# Patient Record
Sex: Female | Born: 1950 | Race: White | Hispanic: No | Marital: Married | Smoking: Former smoker
Health system: Southern US, Community
[De-identification: ages and names within clinical notes are randomized; demographics above are authoritative.]

## PROBLEM LIST (undated history)

## (undated) DIAGNOSIS — E109 Type 1 diabetes mellitus without complications: Secondary | ICD-10-CM

## (undated) DIAGNOSIS — E785 Hyperlipidemia, unspecified: Secondary | ICD-10-CM

## (undated) DIAGNOSIS — Z780 Asymptomatic menopausal state: Secondary | ICD-10-CM

## (undated) HISTORY — DX: Type 1 diabetes mellitus without complications: E10.9

## (undated) HISTORY — PX: CERVICAL CONE BIOPSY: SUR198

## (undated) HISTORY — DX: Hyperlipidemia, unspecified: E78.5

## (undated) HISTORY — DX: Asymptomatic menopausal state: Z78.0

## (undated) HISTORY — PX: CATARACT EXTRACTION: SUR2

## (undated) HISTORY — PX: BLEPHAROPLASTY: SUR158

---

## 1999-04-19 ENCOUNTER — Encounter: Admission: RE | Admit: 1999-04-19 | Discharge: 1999-07-18 | Payer: Self-pay | Admitting: Endocrinology

## 1999-12-15 ENCOUNTER — Other Ambulatory Visit: Admission: RE | Admit: 1999-12-15 | Discharge: 1999-12-15 | Payer: Self-pay | Admitting: Family Medicine

## 2006-04-03 ENCOUNTER — Ambulatory Visit: Payer: Self-pay | Admitting: Family Medicine

## 2006-05-03 ENCOUNTER — Ambulatory Visit: Payer: Self-pay | Admitting: Family Medicine

## 2006-05-03 ENCOUNTER — Other Ambulatory Visit: Admission: RE | Admit: 2006-05-03 | Discharge: 2006-05-03 | Payer: Self-pay | Admitting: Family Medicine

## 2006-05-03 ENCOUNTER — Encounter (INDEPENDENT_AMBULATORY_CARE_PROVIDER_SITE_OTHER): Payer: Self-pay | Admitting: Family Medicine

## 2006-05-03 LAB — CONVERTED CEMR LAB
Basophils Relative: 0.7 % (ref 0.0–1.0)
Cholesterol: 198 mg/dL (ref 0–200)
Eosinophils Absolute: 0.3 10*3/uL (ref 0.0–0.6)
Eosinophils Relative: 6.1 % — ABNORMAL HIGH (ref 0.0–5.0)
HDL: 77.7 mg/dL (ref >39.0)
LDL Cholesterol: 112 mg/dL — ABNORMAL HIGH (ref 0–99)
MCV: 82.4 fL (ref 78.0–100.0)
Monocytes Absolute: 0.5 10*3/uL (ref 0.2–0.7)
Neutrophils Relative %: 61 % (ref 43.0–77.0)
Platelets: 329 10*3/uL (ref 150–400)
RBC: 4.56 M/uL (ref 3.87–5.11)
RDW: 13.1 % (ref 11.5–14.6)
Total CHOL/HDL Ratio: 2.5
Triglycerides: 44 mg/dL (ref 0–149)
VLDL: 9 mg/dL (ref 0–40)

## 2006-08-21 ENCOUNTER — Encounter: Admission: RE | Admit: 2006-08-21 | Discharge: 2006-08-21 | Payer: Self-pay | Admitting: Family Medicine

## 2006-09-01 ENCOUNTER — Encounter (INDEPENDENT_AMBULATORY_CARE_PROVIDER_SITE_OTHER): Payer: Self-pay | Admitting: Family Medicine

## 2006-09-05 ENCOUNTER — Telehealth (INDEPENDENT_AMBULATORY_CARE_PROVIDER_SITE_OTHER): Payer: Self-pay | Admitting: Family Medicine

## 2006-09-07 ENCOUNTER — Ambulatory Visit: Payer: Self-pay | Admitting: Family Medicine

## 2006-09-08 ENCOUNTER — Ambulatory Visit: Payer: Self-pay | Admitting: Family Medicine

## 2006-09-08 LAB — CONVERTED CEMR LAB
Basophils Relative: 0 % (ref 0–1)
HCT: 40 % (ref 36.0–46.0)
Hemoglobin: 12.8 g/dL (ref 12.0–15.0)
Lymphs Abs: 1.9 10*3/uL (ref 0.7–3.3)
MCHC: 32 g/dL (ref 30.0–36.0)
MCV: 84.7 fL (ref 78.0–100.0)
Monocytes Relative: 6 % (ref 3–11)
Neutro Abs: 10.6 10*3/uL — ABNORMAL HIGH (ref 1.7–7.7)
Platelets: 297 10*3/uL (ref 150–400)
RBC: 4.72 M/uL (ref 3.87–5.11)
RDW: 14.6 % — ABNORMAL HIGH (ref 11.5–14.0)

## 2006-09-15 ENCOUNTER — Ambulatory Visit: Payer: Self-pay | Admitting: Family Medicine

## 2006-09-15 ENCOUNTER — Telehealth (INDEPENDENT_AMBULATORY_CARE_PROVIDER_SITE_OTHER): Payer: Self-pay | Admitting: *Deleted

## 2006-09-15 LAB — CONVERTED CEMR LAB
Basophils Absolute: 0.1 10*3/uL (ref 0.0–0.1)
Eosinophils Absolute: 0.7 10*3/uL — ABNORMAL HIGH (ref 0.0–0.6)
HCT: 38.3 % (ref 36.0–46.0)
Hemoglobin: 12.8 g/dL (ref 12.0–15.0)
MCHC: 33.5 g/dL (ref 30.0–36.0)
MCV: 84.2 fL (ref 78.0–100.0)
Neutro Abs: 3 10*3/uL (ref 1.4–7.7)
RDW: 13.2 % (ref 11.5–14.6)
WBC: 6.4 10*3/uL (ref 4.5–10.5)

## 2006-09-18 ENCOUNTER — Encounter (INDEPENDENT_AMBULATORY_CARE_PROVIDER_SITE_OTHER): Payer: Self-pay | Admitting: *Deleted

## 2006-09-27 ENCOUNTER — Encounter (INDEPENDENT_AMBULATORY_CARE_PROVIDER_SITE_OTHER): Payer: Self-pay | Admitting: Family Medicine

## 2006-11-06 ENCOUNTER — Encounter (INDEPENDENT_AMBULATORY_CARE_PROVIDER_SITE_OTHER): Payer: Self-pay | Admitting: Family Medicine

## 2007-01-06 ENCOUNTER — Ambulatory Visit: Payer: Self-pay | Admitting: Internal Medicine

## 2007-01-06 DIAGNOSIS — E109 Type 1 diabetes mellitus without complications: Secondary | ICD-10-CM | POA: Insufficient documentation

## 2007-01-28 ENCOUNTER — Inpatient Hospital Stay (HOSPITAL_COMMUNITY): Admission: EM | Admit: 2007-01-28 | Discharge: 2007-01-31 | Payer: Self-pay | Admitting: Emergency Medicine

## 2007-01-30 ENCOUNTER — Encounter (INDEPENDENT_AMBULATORY_CARE_PROVIDER_SITE_OTHER): Payer: Self-pay | Admitting: Family Medicine

## 2007-01-30 ENCOUNTER — Ambulatory Visit: Payer: Self-pay | Admitting: Internal Medicine

## 2007-04-04 ENCOUNTER — Encounter (INDEPENDENT_AMBULATORY_CARE_PROVIDER_SITE_OTHER): Payer: Self-pay | Admitting: Family Medicine

## 2007-09-27 ENCOUNTER — Encounter: Payer: Self-pay | Admitting: Internal Medicine

## 2007-09-27 ENCOUNTER — Encounter: Payer: Self-pay | Admitting: Family Medicine

## 2007-10-25 ENCOUNTER — Ambulatory Visit: Payer: Self-pay | Admitting: Internal Medicine

## 2007-10-25 DIAGNOSIS — J45909 Unspecified asthma, uncomplicated: Secondary | ICD-10-CM

## 2008-11-06 ENCOUNTER — Encounter: Payer: Self-pay | Admitting: Internal Medicine

## 2009-03-11 ENCOUNTER — Encounter: Payer: Self-pay | Admitting: Internal Medicine

## 2009-04-10 ENCOUNTER — Emergency Department (HOSPITAL_BASED_OUTPATIENT_CLINIC_OR_DEPARTMENT_OTHER): Admission: EM | Admit: 2009-04-10 | Discharge: 2009-04-10 | Payer: Self-pay | Admitting: Emergency Medicine

## 2009-04-10 ENCOUNTER — Ambulatory Visit: Payer: Self-pay | Admitting: Diagnostic Radiology

## 2009-04-15 ENCOUNTER — Ambulatory Visit: Payer: Self-pay | Admitting: Family

## 2009-04-15 DIAGNOSIS — J309 Allergic rhinitis, unspecified: Secondary | ICD-10-CM | POA: Insufficient documentation

## 2009-05-12 ENCOUNTER — Ambulatory Visit: Payer: Self-pay | Admitting: Internal Medicine

## 2009-05-12 ENCOUNTER — Other Ambulatory Visit: Admission: RE | Admit: 2009-05-12 | Discharge: 2009-05-12 | Payer: Self-pay | Admitting: Internal Medicine

## 2009-05-12 DIAGNOSIS — E785 Hyperlipidemia, unspecified: Secondary | ICD-10-CM | POA: Insufficient documentation

## 2009-05-13 ENCOUNTER — Encounter (INDEPENDENT_AMBULATORY_CARE_PROVIDER_SITE_OTHER): Payer: Self-pay | Admitting: *Deleted

## 2009-05-16 LAB — CONVERTED CEMR LAB
Basophils Absolute: 0.1 10*3/uL (ref 0.0–0.1)
Basophils Relative: 4.6 % — ABNORMAL HIGH (ref 0.0–3.0)
CO2: 29 meq/L (ref 19–32)
Calcium: 9.3 mg/dL (ref 8.4–10.5)
Chloride: 99 meq/L (ref 96–112)
Creatinine, Ser: 0.9 mg/dL (ref 0.4–1.2)
Eosinophils Absolute: 0.3 10*3/uL (ref 0.0–0.7)
GFR calc non Af Amer: 68.09 mL/min (ref >60)
HCT: 37.6 % (ref 36.0–46.0)
Hemoglobin: 12.2 g/dL (ref 12.0–15.0)
MCHC: 32.3 g/dL (ref 30.0–36.0)
MCV: 84.3 fL (ref 78.0–100.0)
Monocytes Absolute: 0.6 10*3/uL (ref 0.1–1.0)
Neutro Abs: 0.4 10*3/uL — ABNORMAL LOW (ref 1.4–7.7)
Platelets: 305 10*3/uL (ref 150.0–400.0)
Potassium: 4.8 meq/L (ref 3.5–5.1)
RBC: 4.46 M/uL (ref 3.87–5.11)
Vit D, 25-Hydroxy: 32 ng/mL (ref 30–89)

## 2009-05-26 ENCOUNTER — Encounter: Payer: Self-pay | Admitting: Internal Medicine

## 2009-05-26 ENCOUNTER — Encounter: Admission: RE | Admit: 2009-05-26 | Discharge: 2009-05-26 | Payer: Self-pay | Admitting: Internal Medicine

## 2009-06-01 ENCOUNTER — Ambulatory Visit: Payer: Self-pay | Admitting: Internal Medicine

## 2009-06-08 ENCOUNTER — Ambulatory Visit: Payer: Self-pay | Admitting: Internal Medicine

## 2009-08-11 ENCOUNTER — Ambulatory Visit: Payer: Self-pay | Admitting: Internal Medicine

## 2009-08-11 DIAGNOSIS — B370 Candidal stomatitis: Secondary | ICD-10-CM

## 2009-09-01 ENCOUNTER — Telehealth (INDEPENDENT_AMBULATORY_CARE_PROVIDER_SITE_OTHER): Payer: Self-pay | Admitting: *Deleted

## 2009-09-01 ENCOUNTER — Ambulatory Visit: Payer: Self-pay | Admitting: Internal Medicine

## 2009-09-09 ENCOUNTER — Encounter: Payer: Self-pay | Admitting: Internal Medicine

## 2009-10-12 ENCOUNTER — Encounter: Payer: Self-pay | Admitting: Internal Medicine

## 2009-11-04 ENCOUNTER — Ambulatory Visit: Payer: Self-pay | Admitting: Internal Medicine

## 2009-11-04 DIAGNOSIS — IMO0002 Reserved for concepts with insufficient information to code with codable children: Secondary | ICD-10-CM

## 2009-11-05 ENCOUNTER — Ambulatory Visit: Payer: Self-pay | Admitting: Internal Medicine

## 2009-12-07 ENCOUNTER — Encounter: Payer: Self-pay | Admitting: Critical Care Medicine

## 2009-12-07 ENCOUNTER — Ambulatory Visit: Payer: Self-pay | Admitting: Critical Care Medicine

## 2009-12-08 ENCOUNTER — Encounter: Payer: Self-pay | Admitting: Internal Medicine

## 2010-01-06 ENCOUNTER — Telehealth: Payer: Self-pay | Admitting: Critical Care Medicine

## 2010-02-16 ENCOUNTER — Encounter: Payer: Self-pay | Admitting: Critical Care Medicine

## 2010-02-16 ENCOUNTER — Ambulatory Visit
Admission: RE | Admit: 2010-02-16 | Discharge: 2010-02-16 | Payer: Self-pay | Source: Home / Self Care | Attending: Critical Care Medicine | Admitting: Critical Care Medicine

## 2010-03-07 ENCOUNTER — Encounter: Payer: Self-pay | Admitting: Family Medicine

## 2010-03-16 NOTE — Letter (Signed)
Summary: [facility]: Immunoassay Fecal Occult Blood (iFOB) Order Form  [facility] [place]  [address] [place], [place] [address]   Phone: [phone]  Fax: [phone]      [facility]: Immunoassay Fecal Occult Blood (iFOB) Order Form   May 13, 2009 MRN: [id number]   April Little Nov 02, 1950   Physicican Name:__________paz   _______________  Diagnosis Code:___________v76.51_______________      April Little

## 2010-03-16 NOTE — Letter (Signed)
Summary: [facility]  [facility]   Imported By: Lanelle Bal 03/23/2009 08:10:43  _____________________________________________________________________  External Attachment:    Type:   Image     Comment:   External Document

## 2010-03-16 NOTE — Assessment & Plan Note (Signed)
Summary: 4 MONTH OV//PH//rsh from bmp//lch   Vital Signs:  Patient profile:   60 year old female Weight:      182.50 pounds Pulse rate:   83 / minute Pulse rhythm:   regular BP sitting:   126 / 82  (left arm) Cuff size:   regular  Vitals Entered By: Army Fossa CMA (September 01, 2009 2:41 PM) CC: Pt here to f/u. Comments Still having issues with her mouth and thrush.   History of Present Illness: followup from last office visit She was treated for  thrush with fluconazole, head redness and white spots disappeared but she is still uncomfortable with a raw feeling in her tonge and severe dry mouth  She was rec. to restart advair  for asthma, she has not been taking it because the mouth dryness Good compliance with singulair  ROS Continue with occ.  wheezing and mild cough She is able to play tennis but  gets short of breath when she runs up the stairs Denies any dryness in the eyes or vagina  Current Medications (verified): 1)  Proventil Hfa 108 (90 Base) Mcg/act Aers (Albuterol Sulfate) .... Inhale 2 Puff As Directed Four Times A Day 2)  Accu-Chek Spirit Insulin Pump  Kit (Insulin Infusion Pump) 3)  Advair Diskus 250-50 Mcg/dose Misc (Fluticasone-Salmeterol) .... Inhale 1 Puff As Directed Twice A Day 4)  Onetouch Ultra Test  Strp (Glucose Blood) .... Use 1 Strip Five Times A Day 5)  Bd Ultra-Fine Pen Needles 29g X 12.52mm  Misc (Insulin Pen Needle) .... Use One Needle As Directed. 6)  Allegra 180 Mg Tabs (Fexofenadine Hcl) 7)  Singulair 10 Mg Tabs (Montelukast Sodium) .... One Tablet By Mouth Daily 8)  Simvastatin 5 Mg Tabs (Simvastatin) .Marland Kitchen.. 1 By Mouth At Bedtime - Per Endo 9)  Alendronate Sodium 70 Mg Tabs (Alendronate Sodium) .Marland Kitchen.. 1 By Mouth Every Wk  Allergies (verified): No Known Drug Allergies  Past History:  Past Medical History: Reviewed history from 05/12/2009 and no changes required. Diabetes mellitus, type I-- f/u at [facility] Asthma Pneumonia, hx of  2008 menopause onset : aprox at age 40 Hyperlipidemia  Past Surgical History: Reviewed history from 05/12/2009 and no changes required. G2 P2;  Conization when she was on her  86s  Social History: Reviewed history from 05/12/2009 and no changes required. Married 2 children son with Cerebral Palsy tobacco-- quit many years ago ETOH-- rarely diet-- ok exercise-- tennis , golf  Review of Systems      See HPI  Physical Exam  General:  alert and well-developed.   Mouth:  no redness or discharge Mouth slightly dry Lungs:  normal respiratory effort, no intercostal retractions, and no accessory muscle use.  no rhonchi, wheezing bilaterally. No increased work of breathing Heart:  Normal rate and regular rhythm. S1 and S2 normal without gallop, murmur, click, rub or other extra sounds.   Impression & Recommendations:  Problem # 1:  THRUSH (ICD-112.0)  status post treatment for thrush Better but not completely well despite the fact that she's not taking Advair  On exam, her mouth is dry. She denies dryness in the eyes or vagina unclear if symptoms are just from thrush  ( which will be difficult to eradicate if diabetes is not well controlled) or from dry mouth syndrome Plan: Magic mouthwash ENT referral  Orders: ENT Referral (ENT)  Problem # 2:  ASTHMA (ICD-493.90) good compliance with Singulair, patient not taking advair  sx not well controlled we are between  a rock and a hard place: as long as her mouth is not right, she won't use her asthma medication.  We finally agreed to try to make her mouth better ( see #1) and she agreed to use advair knows to gargle and remove excess medication from the mouth after each use of inhalers  Her updated medication list for this problem includes:    Proventil Hfa 108 (90 Base) Mcg/act Aers (Albuterol sulfate) ..... Inhale 2 puff as directed four times a day    Advair Diskus 250-50 Mcg/dose Misc (Fluticasone-salmeterol) ..... Inhale 1  puff as directed twice a day    Singulair 10 Mg Tabs (Montelukast sodium) ..... One tablet by mouth daily  Problem # 3:  DIABETES MELLITUS, TYPE I (ICD-250.01) Well control? Patient reports a hemoglobin A1c of around 8.1 Management per endocrine  Complete Medication List: 1)  Proventil Hfa 108 (90 Base) Mcg/act Aers (Albuterol sulfate) .... Inhale 2 puff as directed four times a day 2)  Accu-chek Spirit Insulin Pump Kit (Insulin infusion pump) 3)  Advair Diskus 250-50 Mcg/dose Misc (Fluticasone-salmeterol) .... Inhale 1 puff as directed twice a day 4)  Onetouch Ultra Test Strp (Glucose blood) .... Use 1 strip five times a day 5)  Bd Ultra-fine Pen Needles 29g X 12.43mm Misc (Insulin pen needle) .... Use one needle as directed. 6)  Allegra 180 Mg Tabs (Fexofenadine hcl) 7)  Singulair 10 Mg Tabs (Montelukast sodium) .... One tablet by mouth daily 8)  Simvastatin 5 Mg Tabs (Simvastatin) .Marland Kitchen.. 1 by mouth at bedtime - per endo 9)  Alendronate Sodium 70 Mg Tabs (Alendronate sodium) .Marland Kitchen.. 1 by mouth every wk 10)  Magic Mouth Wash  .Marland Kitchen.. 100cc  swish and spit  4 times a day x 1 week  Patient Instructions: 1)  Please schedule a follow-up appointment in 3 months .  Prescriptions: MAGIC MOUTH WASH 100cc  swish and spit  4 times a day x 1 week  #1 x 1   Entered and Authorized by:   Sitlaly Gudiel E. Idan Prime MD   Signed by:   Nolon Rod. Courteny Egler MD on 09/01/2009   Method used:   Print then Give to Patient   RxID:   [id number]

## 2010-03-16 NOTE — Assessment & Plan Note (Signed)
Summary: cpx/kdc   Vital Signs:  Patient profile:   60 year old female Height:      68.5 inches Weight:      192.2 pounds BMI:     28.90 Pulse rate:   68 / minute BP sitting:   120 / 60  Vitals Entered By: Shary Decamp (May 12, 2009 2:23 PM) CC: cpx - not fasting   History of Present Illness: CPX  Current Medications (verified): 1)  Proventil Hfa 108 (90 Base) Mcg/act Aers (Albuterol Sulfate) .... Inhale 2 Puff As Directed Four Times A Day 2)  Accu-Chek Spirit Insulin Pump  Kit (Insulin Infusion Pump) 3)  Advair Diskus 250-50 Mcg/dose Misc (Fluticasone-Salmeterol) .... Inhale 1 Puff As Directed Twice A Day 4)  Onetouch Ultra Test  Strp (Glucose Blood) .... Use 1 Strip Five Times A Day 5)  Bd Ultra-Fine Pen Needles 29g X 12.66mm  Misc (Insulin Pen Needle) .... Use One Needle As Directed. 6)  Zyrtec Allergy 10 Mg Tabs (Cetirizine Hcl) .... Take 1 Tablet By Mouth Once A Day 7)  Nasonex 50 Mcg/act Susp (Mometasone Furoate) .... Two Sprays Each Nostril Daily 8)  Singulair 10 Mg Tabs (Montelukast Sodium) .... One Tablet By Mouth Daily 9)  Simvastatin 5 Mg Tabs (Simvastatin) .Marland Kitchen.. 1 By Mouth At Bedtime - Per Endo  Allergies (verified): No Known Drug Allergies  Past History:  Past Medical History: Diabetes mellitus, type I-- f/u at [facility] Asthma Pneumonia, hx of 2008 menopause onset : aprox at age 60 Hyperlipidemia  Past Surgical History: G2 P2;  Conization when she was on her  60s  Family History: M - deceased, complications due to DM, liver Failure F - deceased, cirrhosis, +ETOH DM - M, bro CAD - M  family, 2 B  (early in life) stroke - no colon Ca - no breast Ca - no  Social History: Married 2 children son with Cerebral Palsy tobacco-- quit many years ago ETOH-- rarely diet-- ok exercise-- tennis , golf  Review of Systems General:  Denies fatigue and fever. CV:  Denies chest pain or discomfort, palpitations, and swelling of feet. Resp:  asthma ok in the last  few weeks . GI:  Denies bloody stools, nausea, and vomiting. GU:  no vag d/c or spotting + vag dryness no hot flashes .  Physical Exam  General:  alert, well-developed, and well-nourished.   Neck:  no masses and no thyromegaly.   Breasts:  No mass, nodules, thickening, tenderness, bulging, retraction, inflamation, nipple discharge or skin changes noted.   Lungs:  normal respiratory effort, no intercostal retractions, no accessory muscle use, and normal breath sounds.   Heart:  normal rate, regular rhythm, no murmur, and no gallop.   Abdomen:  soft, non-tender, no distention, no masses, no guarding, and no rigidity.   Rectal:  No external abnormalities noted. Normal sphincter tone. No rectal masses or tenderness. Hemoccult-negative Genitalia:  normal introitus and no vaginal discharge.   vagina looks pale, dry, slightly atresic. cervix hard to visualize but no obvious lesions or discharge  Extremities:  no edema  Diabetes Management Exam:    Eye Exam:       Eye Exam done elsewhere          Date: 02/15/2007          Results: normal          Done by: hecker   Impression & Recommendations:  Problem # 1:  ROUTINE GENERAL MEDICAL EXAM@HEALTH  CARE FACL (ICD-V70.0) diet--->having trouble loosing  wt, refer to a nutritionist   TD 08 pneumonia shot--> 11-10  last  gyn exam 1999? h/o conization, several normal PAPs afterwards ----> pap a sent today; it was hard to visualize the remaining of the cervix, if we didn't get a good sample will refer to gyn  has atrophic vaginitis ----> offered premarin cream, declined   last MMG aprox 3 years ago  ----> ordering a MMG SBE recommended   colonoscopy-- never Colonoscopy Vs.iFOB cards reviewed w/ pt. Provided  iFOB but he will  call if he decides to have a  colonoscopy   DEXA recommended -- will order one   Orders: Venipuncture ([address]) TLB-BMP (Basic Metabolic Panel-BMET) (80048-METABOL) TLB-CBC Platelet - w/Differential  (85025-CBCD) TLB-TSH (Thyroid Stimulating Hormone) (84443-TSH) T-Vitamin D (25-Hydroxy) ([address]454-09811) Nutrition Referral (Nutrition) Radiology Referral (Radiology) Radiology Referral (Radiology)  Problem # 2:  ASTHMA (ICD-493.90) symptoms lately well controlled  Her updated medication list for this problem includes:    Proventil Hfa 108 (90 Base) Mcg/act Aers (Albuterol sulfate) ..... Inhale 2 puff as directed four times a day    Advair Diskus 250-50 Mcg/dose Misc (Fluticasone-salmeterol) ..... Inhale 1 puff as directed twice a day    Singulair 10 Mg Tabs (Montelukast sodium) ..... One tablet by mouth daily  Problem # 3:  DIABETES MELLITUS, TYPE I (ICD-250.01) per endocrinology  Problem # 4:  HYPERLIPIDEMIA (ICD-272.4) per endocrinology  Her updated medication list for this problem includes:    Simvastatin 5 Mg Tabs (Simvastatin) .Marland Kitchen... 1 by mouth at bedtime - per endo  Complete Medication List: 1)  Proventil Hfa 108 (90 Base) Mcg/act Aers (Albuterol sulfate) .... Inhale 2 puff as directed four times a day 2)  Accu-chek Spirit Insulin Pump Kit (Insulin infusion pump) 3)  Advair Diskus 250-50 Mcg/dose Misc (Fluticasone-salmeterol) .... Inhale 1 puff as directed twice a day 4)  Onetouch Ultra Test Strp (Glucose blood) .... Use 1 strip five times a day 5)  Bd Ultra-fine Pen Needles 29g X 12.68mm Misc (Insulin pen needle) .... Use one needle as directed. 6)  Zyrtec Allergy 10 Mg Tabs (Cetirizine hcl) .... Take 1 tablet by mouth once a day 7)  Nasonex 50 Mcg/act Susp (Mometasone furoate) .... Two sprays each nostril daily 8)  Singulair 10 Mg Tabs (Montelukast sodium) .... One tablet by mouth daily 9)  Simvastatin 5 Mg Tabs (Simvastatin) .Marland Kitchen.. 1 by mouth at bedtime - per endo  Patient Instructions: 1)  Please schedule a follow-up appointment in 4 months .    Preventive Care Screening  Prior Values:    Mammogram:  normal (08/21/2006)    Last Pneumovax:  Pneumovax  (10/25/2007)     Family History:    M - deceased, complications due to DM, liver Failure    F - deceased, cirrhosis, +ETOH    DM - M, bro    CAD - M  family, 2 B  (early in life)    stroke - no    colon Ca - no    breast Ca - no  Social History:    Married    2 children    son with Cerebral Palsy    tobacco-- quit many years ago    ETOH-- rarely    diet-- ok    exercise-- tennis , golf    Immunization History:  Pneumovax Immunization History:    Pneumovax:  Pneumovax (12/15/2008)  Tetanus/Td Immunization History:    Tetanus/Td:  adacel (05/03/2006)    Immunization History:  Pneumovax Immunization History:  Pneumovax:  pneumovax (12/15/2008)  Tetanus/Td Immunization History:    Tetanus/Td:  adacel (05/03/2006)

## 2010-03-16 NOTE — Assessment & Plan Note (Signed)
Summary: bronchitis/asthma- jr   Vital Signs:  Patient profile:   60 year old female Height:      68.5 inches Weight:      189.25 pounds BMI:     28.46 O2 Sat:      95 % on Room air Temp:     97.7 degrees F oral Pulse rate:   72 / minute Pulse rhythm:   regular Resp:     16 per minute BP sitting:   140 / 78  (left arm) Cuff size:   regular  Vitals Entered By: Mervin Kung CMA (April 15, 2009 9:38 AM)  O2 Flow:  Room air CC: room 17  Head and face feel swollen, has runny nose,  cough is improving. Comments Pt. was seen in ER on Friday. Competed Prednisone.   CC:  room 17  Head and face feel swollen, has runny nose, and cough is improving.Marland Kitchen  History of Present Illness: April Little is a 60 year old female who presents today in follow up [facility] where she was diagnosed with bronchitis.  She was sent home with new prescription for advair/albuterol/robitussin AC and prednisone taper.  She has completed prednisone.  Notes that her breathingis better with these medications.  Now she has started to develop some "runny nose" and sneezing which she attributes to her seasonal allergies "kicking in."  Denies fever.   Allergies (verified): No Known Drug Allergies  Physical Exam  General:  Well-developed,well-nourished,in no acute distress; alert,appropriate and cooperative throughout examination Head:  Normocephalic and atraumatic without obvious abnormalities. No apparent alopecia or balding. Ears:  External ear exam shows no significant lesions or deformities.  Otoscopic examination reveals clear canals, tympanic membranes are intact bilaterally without bulging, retraction, inflammation or discharge. Hearing is grossly normal bilaterally. Mouth:  Oral mucosa and oropharynx without lesions or exudates.  Teeth in good repair. Neck:  No deformities, masses, or tenderness noted. Lungs:  R sided expiratory wheeze- cleared with cough.  No crackles, no  increase WOB Heart:  Normal rate and regular rhythm. S1 and S2 normal without gallop, murmur, click, rub or other extra sounds.   Impression & Recommendations:  Problem # 1:  ASTHMA (ICD-493.90) Assessment Improved Symptoms improved with addition of advair, however she notes that she is still using proventil frequently.  I advised her to resume singulair as well.  Also suggested that she premedicate with proventil prior to exercise as this may decrease some of her asthma symptoms during exercise.   The following medications were removed from the medication list:    Singulair 10 Mg Tabs (Montelukast sodium) .Marland Kitchen... 1 qd Her updated medication list for this problem includes:    Proventil Hfa 108 (90 Base) Mcg/act Aers (Albuterol sulfate) ..... Inhale 2 puff as directed four times a day    Advair Diskus 250-50 Mcg/dose Misc (Fluticasone-salmeterol) ..... Inhale 1 puff as directed twice a day    Singulair 10 Mg Tabs (Montelukast sodium) ..... One tablet by mouth daily  Problem # 2:  ALLERGIC RHINITIS (ICD-477.9) patient given sample of nasonex.   Her updated medication list for this problem includes:    Zyrtec Allergy 10 Mg Tabs (Cetirizine hcl) .Marland Kitchen... Take 1 tablet by mouth once a day    Nasonex 50 Mcg/act Susp (Mometasone furoate) .Marland Kitchen..Marland Kitchen Two sprays each nostril daily  Complete Medication List: 1)  Proventil Hfa 108 (90 Base) Mcg/act Aers (Albuterol sulfate) .... Inhale 2 puff as directed four  times a day 2)  Accu-chek Spirit Insulin Pump Kit (Insulin infusion pump) 3)  Advair Diskus 250-50 Mcg/dose Misc (Fluticasone-salmeterol) .... Inhale 1 puff as directed twice a day 4)  Onetouch Ultra Test Strp (Glucose blood) .... Use 1 strip five times a day 5)  Bd Ultra-fine Pen Needles 29g X 12.71mm Misc (Insulin pen needle) .... Use one needle as directed. 6)  Zyrtec Allergy 10 Mg Tabs (Cetirizine hcl) .... Take 1 tablet by mouth once a day 7)  Nasonex 50 Mcg/act Susp (Mometasone furoate) .... Two  sprays each nostril daily 8)  Nystatin 100000 Unit/ml Susp (Nystatin) .... 5 cc swish and swallow three times a day x 1-2 weeks or until gone 9)  Singulair 10 Mg Tabs (Montelukast sodium) .... One tablet by mouth daily  Patient Instructions: 1)  Stop afrin and start nasonex. 2)  If you are needing to use your proventil more than 2-3 x a week, please resume singulair. Prescriptions: SINGULAIR 10 MG TABS (MONTELUKAST SODIUM) one tablet by mouth daily  #30 x 2   Entered and Authorized by:   Lemont Fillers FNP   Signed by:   Lemont Fillers FNP on 04/15/2009   Method used:   Electronically to        [id number]* (retail)       [address]       [place], [place]  [address]       Ph: [phone]       Fax: [phone]   RxID:   [id number] ADVAIR DISKUS 250-50 MCG/DOSE MISC (FLUTICASONE-SALMETEROL) Inhale 1 puff as directed twice a day  #3 x 0   Entered and Authorized by:   Lemont Fillers FNP   Signed by:   Lemont Fillers FNP on 04/15/2009   Method used:   Faxed to ...       [facility] Fax) (mail-order)             ,          Ph: [phone]       Fax: [phone]   RxID:   [id number] PROVENTIL HFA 108 (90 BASE) MCG/ACT AERS (ALBUTEROL SULFATE) Inhale 2 puff as directed four times a day  #3 x 0   Entered and Authorized by:   Lemont Fillers FNP   Signed by:   Lemont Fillers FNP on 04/15/2009   Method used:   Faxed to ...       [facility] Fax) (mail-order)             ,          Ph: [phone]       Fax: [phone]   RxID:   [id number] NYSTATIN 100000 UNIT/ML SUSP (NYSTATIN) 5 cc swish and swallow three times a day x 1-2 weeks or until gone  #1 bottle x 0   Entered and Authorized by:   Lemont Fillers FNP   Signed by:   Lemont Fillers FNP on 04/15/2009   Method used:   Electronically to        [id number]*  (retail)       [address]       [place], [place]  [address]       Ph: [phone]       Fax: [phone]  RxID:   [id number] NASONEX 50 MCG/ACT SUSP (MOMETASONE FUROATE) two sprays each nostril daily  #1 x 2   Entered and Authorized by:   Lemont Fillers FNP   Signed by:   Lemont Fillers FNP on 04/15/2009   Method used:   Electronically to        [id number]* (retail)       [address]       [place], [place]  [address]       Ph: [phone]       Fax: [phone]   RxID:   [id number]   Current Allergies (reviewed today): No known allergies

## 2010-03-16 NOTE — Assessment & Plan Note (Signed)
Summary: Pulmonary Consultation   Copy to:  Dr. Willow Ora Primary Provider/Referring Provider:  Dr. Willow Ora  CC:  Pulmonary Consult - asthma..  History of Present Illness: Pulmonary Consultation       This is a 60 year old female who presents with asthma.  The patient complains of history of diagnosed Asthma, cough, shortness of breath, chest tightness, and wheezing, but denies chest pain, mucous production, nocturnal awakening, exercise induced symptoms, and congestion.  Symptoms appear triggered by exercise, allergen:, and irritant:.  The patient also has the following associated problems: allergic rhinitis, nasal congestion, difficulty breathing through nose, productive cough, chest pain, and chest tightness.  Previous effective treatment includes short acting beta-agonist:, ICS + LABA, leukotriene agonist, and oral steroids.  Asthma monitoring and treatment delivery to date has included: no home nebulizer and no PF meter.  Asthma severity risk factors include: ER visits in last year:.   THis pt has been Dx asthma for 39yrs The asthma is is not seasonal The pt uses albuterol once daily and ave twic e in one week The past allergy testing rast positive Grasses and trees The pt rx advair and albuterol and singulair and allegra is daily The pt only uses advair as needed  The dyspnea comes and goes The mucus is thick and yellow  Asthma History    Initial Asthma Severity Rating:    Age range: 12+ years    Symptoms: daily    Nighttime Awakenings: 3-4/month    Interferes w/ normal activity: some limitations    SABA use (not for EIB): >2 days/week but not >1X/day    Exacerbations requiring oral systemic steroids: 0-1/year    Asthma Severity Assessment: Moderate Persistent   Preventive Screening-Counseling & Management  Alcohol-Tobacco     Smoking Status: quit > 6 months     Year Quit: 1980  Current Medications (verified): 1)  Proventil Hfa 108 (90 Base) Mcg/act Aers (Albuterol  Sulfate) .... Inhale 2 Puff As Directed Four Times A Day 2)  Accu-Chek Spirit Insulin Pump  Kit (Insulin Infusion Pump) 3)  Advair Diskus 250-50 Mcg/dose Misc (Fluticasone-Salmeterol) .... Inhale 1 Puff As Directed Twice A Day 4)  Onetouch Ultra Test  Strp (Glucose Blood) .... Use 1 Strip Five Times A Day 5)  Bd Ultra-Fine Pen Needles 29g X 12.44mm  Misc (Insulin Pen Needle) .... Use One Needle As Directed. 6)  Allegra 180 Mg Tabs (Fexofenadine Hcl) .... Take 1 Tablet By Mouth Once A Day 7)  Singulair 10 Mg Tabs (Montelukast Sodium) .... One Tablet By Mouth Daily 8)  Simvastatin 5 Mg Tabs (Simvastatin) .Marland Kitchen.. 1 By Mouth At Bedtime - Per Endo 9)  Alendronate Sodium 70 Mg Tabs (Alendronate Sodium) .Marland Kitchen.. 1 By Mouth Every Wk 10)  Apidra 100 Unit/ml Soln (Insulin Glulisine) .... As Directed 11)  Meloxicam 15 Mg Tabs (Meloxicam) .... As Needed  Allergies (verified): No Known Drug Allergies  Past History:  Past medical, surgical, family and social histories (including risk factors) reviewed, and no changes noted (except as noted below).  Past Medical History: Reviewed history from 05/12/2009 and no changes required. Diabetes mellitus, type I-- f/u at [facility] Asthma Pneumonia, hx of 2008 menopause onset : aprox at age 48 Hyperlipidemia  Past Surgical History: Reviewed history from 05/12/2009 and no changes required. G2 P2;  Conization when she was on her  84s  Family History: Reviewed history from 05/12/2009 and no changes required. M - deceased, complications due to DM, liver Failure, heart disease F -  deceased, cirrhosis, +ETOH DM - M, bro brother - heart disease CAD - M  family, 2 B  (early in life) stroke - no colon Ca - no breast Ca - no  Social History: Reviewed history from 05/12/2009 and no changes required. Married 2 children son with Cerebral Palsy tobacco-- quit in 1980.  3 ppd x 10 yrs ETOH-- rarely diet-- ok exercise-- tennis , golfSmoking Status:  quit > 6  months  Review of Systems       The patient complains of shortness of breath with activity, shortness of breath at rest, productive cough, and nasal congestion/difficulty breathing through nose.  The patient denies non-productive cough, coughing up blood, chest pain, irregular heartbeats, acid heartburn, indigestion, loss of appetite, weight change, abdominal pain, difficulty swallowing, sore throat, tooth/dental problems, headaches, sneezing, itching, ear ache, anxiety, depression, hand/feet swelling, joint stiffness or pain, rash, change in color of mucus, and fever.        See HPI for Pulmonary, Cardiac, General, and ENT review of systems.  Vital Signs:  Patient profile:   60 year old female Height:      68 inches Weight:      184 pounds BMI:     28.08 O2 Sat:      98 % on Room air Temp:     98.2 degrees F oral Pulse rate:   80 / minute BP sitting:   122 / 76  (left arm) Cuff size:   regular  Vitals Entered By: Gweneth Dimitri RN (December 07, 2009 1:44 PM)  O2 Flow:  Room air rCC: Pulmonary Consult - asthma. Comments Medications reviewed with patient Daytime contact number verified with patient. Gweneth Dimitri RN  December 07, 2009 1:44 PM    Physical Exam  Additional Exam:  Gen: Pleasant, well-nourished, in no distress,  normal affect ENT: No lesions,  mouth clear,  oropharynx clear, no postnasal drip Neck: No JVD, no TMG, no carotid bruits Lungs: No use of accessory muscles, no dullness to percussion, insp/exp wheeze, poor airflow Cardiovascular: RRR, heart sounds normal, no murmur or gallops, no peripheral edema Abdomen: soft and NT, no HSM,  BS normal Musculoskeletal: No deformities, no cyanosis or clubbing Neuro: alert, non focal Skin: Warm, no lesions or rashes    Impression & Recommendations:  Problem # 1:  ASTHMA (ICD-493.90) Assessment Deteriorated Severe persistent asthma ? atopic factors.  poor compliance with advair.  Signficant asthmatic bronchitic  component Moderate airflow obstruction on spirometry plan use advair on regular basis assess IgE>>>12  low  pulse prednisone as needed saba  Medications Added to Medication List This Visit: 1)  Accu-chek Spirit Insulin Pump Kit (Insulin infusion pump) .... 1.7 units/hr 2)  Allegra 180 Mg Tabs (Fexofenadine hcl) .... Take 1 tablet by mouth once a day 3)  Apidra 100 Unit/ml Soln (Insulin glulisine) .... As directed 4)  Meloxicam 15 Mg Tabs (Meloxicam) .... As needed 5)  Prednisone 10 Mg Tabs (Prednisone) .... Take as directed take 4 daily for two days, then 3 daily for two days, then two daily for two days then one daily for two days then stop  Complete Medication List: 1)  Proventil Hfa 108 (90 Base) Mcg/act Aers (Albuterol sulfate) .... Inhale 2 puff as directed four times a day 2)  Accu-chek Spirit Insulin Pump Kit (Insulin infusion pump) .... 1.7 units/hr 3)  Advair Diskus 250-50 Mcg/dose Misc (Fluticasone-salmeterol) .... Inhale 1 puff as directed twice a day 4)  Onetouch Ultra Test Strp (Glucose blood) .... Use  1 strip five times a day 5)  Bd Ultra-fine Pen Needles 29g X 12.31mm Misc (Insulin pen needle) .... Use one needle as directed. 6)  Allegra 180 Mg Tabs (Fexofenadine hcl) .... Take 1 tablet by mouth once a day 7)  Singulair 10 Mg Tabs (Montelukast sodium) .... One tablet by mouth daily 8)  Simvastatin 5 Mg Tabs (Simvastatin) .Marland Kitchen.. 1 by mouth at bedtime - per endo 9)  Alendronate Sodium 70 Mg Tabs (Alendronate sodium) .Marland Kitchen.. 1 by mouth every wk 10)  Apidra 100 Unit/ml Soln (Insulin glulisine) .... As directed 11)  Meloxicam 15 Mg Tabs (Meloxicam) .... As needed 12)  Prednisone 10 Mg Tabs (Prednisone) .... Take as directed take 4 daily for two days, then 3 daily for two days, then two daily for two days then one daily for two days then stop  Other Orders: Spirometry w/Graph (94010) New Patient Level V (60454) T-IgE (Immunoglobulin E) (09811-91478)  Patient Instructions: 1)   You need to stay on Advair one puff twice daily on schedule 2)  Prednisone 10mg  Take 4 daily for two days, then 3 daily for two days, then two daily for two days then one daily for two days then stop 3)  Labs today to assess IgE level 4)  Proventil is as needed 5)  Return 3 months Prescriptions: PREDNISONE 10 MG  TABS (PREDNISONE) Take as directed Take 4 daily for two days, then 3 daily for two days, then two daily for two days then one daily for two days then stop  #20 x 0   Entered and Authorized by:   Storm Frisk MD   Signed by:   Storm Frisk MD on 12/07/2009   Method used:   Print then Give to Patient   RxID:   [id number]    Immunization History:  Influenza Immunization History:    Influenza:  historical (12/15/2008)

## 2010-03-16 NOTE — Letter (Signed)
Summary: [facility]  [facility]   Imported By: Lanelle Bal 10/26/2009 14:23:10  _____________________________________________________________________  External Attachment:    Type:   Image     Comment:   External Document

## 2010-03-16 NOTE — Consult Note (Signed)
Summary: [facility]  [facility]   Imported By: Lanelle Bal 09/16/2009 14:04:14  _____________________________________________________________________  External Attachment:    Type:   Image     Comment:   External Document  Appended Document: [facility] dx---burning mouth syndrome Prescribed capsaicin liquid,    ?Neurontin

## 2010-03-16 NOTE — Letter (Signed)
Summary: Retinopathy  OD-----[facility]  [facility]   Imported By: Lanelle Bal 12/17/2009 15:22:23  _____________________________________________________________________  External Attachment:    Type:   Image     Comment:   External Document

## 2010-03-16 NOTE — Progress Notes (Signed)
Summary: Magic Mouth wash ?  Phone Note From Pharmacy   Caller: [facility] [address] #[id number]* Summary of Call: They have a question about the magic mouth wash call them back at [phone] Initial call taken by: Kathrynn Speed CMA,  September 01, 2009 4:28 PM  Follow-up for Phone Call        Spoke with pharmacy confirmed with pharmacy. Army Fossa CMA  September 01, 2009 4:33 PM

## 2010-03-16 NOTE — Assessment & Plan Note (Signed)
Summary: PULLED CHEST MUSCLE/RH.....   Vital Signs:  Patient profile:   60 year old female Weight:      185.13 pounds Pulse rate:   87 / minute Pulse rhythm:   regular BP sitting:   126 / 86  (left arm) Cuff size:   regular  Vitals Entered By: Army Fossa CMA (November 04, 2009 3:02 PM) CC: Pt c/o pulled muscle under L arm? , Abdominal Pain Comments Felt the pain last friday Played Tennis last week and has been coughing alot Pharm- [facility]    History of Present Illness: was playing a  Ambulance person, 2 matches a day During one of the matches she suddenly developed pain in the left lateral chest wall, worse specifically with certain left arm motion (like elevating  the left arm). Some discomfort with deep breathing. pain got worse after she went home and took care of his 200 pound son who has cerebral palsy  ROS No fever Mild cough which is not unusual for her Mild difficulty breathing mostly because she can't take deep breaths No rash in the chest no recent airplane trips or leg swelling  Current Medications (verified): 1)  Proventil Hfa 108 (90 Base) Mcg/act Aers (Albuterol Sulfate) .... Inhale 2 Puff As Directed Four Times A Day 2)  Accu-Chek Spirit Insulin Pump  Kit (Insulin Infusion Pump) 3)  Advair Diskus 250-50 Mcg/dose Misc (Fluticasone-Salmeterol) .... Inhale 1 Puff As Directed Twice A Day 4)  Onetouch Ultra Test  Strp (Glucose Blood) .... Use 1 Strip Five Times A Day 5)  Bd Ultra-Fine Pen Needles 29g X 12.42mm  Misc (Insulin Pen Needle) .... Use One Needle As Directed. 6)  Allegra 180 Mg Tabs (Fexofenadine Hcl) 7)  Singulair 10 Mg Tabs (Montelukast Sodium) .... One Tablet By Mouth Daily 8)  Simvastatin 5 Mg Tabs (Simvastatin) .Marland Kitchen.. 1 By Mouth At Bedtime - Per Endo 9)  Alendronate Sodium 70 Mg Tabs (Alendronate Sodium) .Marland Kitchen.. 1 By Mouth Every Wk  Allergies (verified): No Known Drug Allergies  Past History:  Past Medical History: Reviewed history  from 05/12/2009 and no changes required. Diabetes mellitus, type I-- f/u at [facility] Asthma Pneumonia, hx of 2008 menopause onset : aprox at age 75 Hyperlipidemia  Past Surgical History: Reviewed history from 05/12/2009 and no changes required. G2 P2;  Conization when she was on her  22s  Social History: Reviewed history from 05/12/2009 and no changes required. Married 2 children son with Cerebral Palsy tobacco-- quit many years ago ETOH-- rarely diet-- ok exercise-- tennis , golf  Physical Exam  General:  alert and well-developed.   Chest Wall:  tender to palpation at the left lateral chest wall (distally). no rash Lungs:  normal respiratory effort and no intercostal retractions.  bilateral wheezing, rhonchi. No increased work of breathing Heart:  normal rate, regular rhythm, and no murmur.   Abdomen:  soft, non-tender, no distention, no masses, and no guarding.   Msk:  antalgic posture, pain when she lay down to be examined Extremities:  calves  are symmetric and nontender   Impression & Recommendations:  Problem # 1:  STRAIN, CHEST WALL (ICD-848.8)  symptoms most likely due to a chest wall strain DX includes a pneumothorax although I doubt that plan: Rest the best  she can Chest x-ray Hydrocodone  Orders: T-2 View CXR (71020TC)  Problem # 2:  ASTHMA (ICD-493.90)  continued to poorly controlled asthma I recommend evaluation by a specialist, she agreed, will refer to one of our pulmonologists  Her updated medication list for this problem includes:    Proventil Hfa 108 (90 Base) Mcg/act Aers (Albuterol sulfate) ..... Inhale 2 puff as directed four times a day    Advair Diskus 250-50 Mcg/dose Misc (Fluticasone-salmeterol) ..... Inhale 1 puff as directed twice a day    Singulair 10 Mg Tabs (Montelukast sodium) ..... One tablet by mouth daily  Orders: Pulmonary Referral (Pulmonary)  Complete Medication List: 1)  Proventil Hfa 108 (90 Base) Mcg/act Aers (Albuterol  sulfate) .... Inhale 2 puff as directed four times a day 2)  Accu-chek Spirit Insulin Pump Kit (Insulin infusion pump) 3)  Advair Diskus 250-50 Mcg/dose Misc (Fluticasone-salmeterol) .... Inhale 1 puff as directed twice a day 4)  Onetouch Ultra Test Strp (Glucose blood) .... Use 1 strip five times a day 5)  Bd Ultra-fine Pen Needles 29g X 12.31mm Misc (Insulin pen needle) .... Use one needle as directed. 6)  Allegra 180 Mg Tabs (Fexofenadine hcl) 7)  Singulair 10 Mg Tabs (Montelukast sodium) .... One tablet by mouth daily 8)  Simvastatin 5 Mg Tabs (Simvastatin) .Marland Kitchen.. 1 by mouth at bedtime - per endo 9)  Alendronate Sodium 70 Mg Tabs (Alendronate sodium) .Marland Kitchen.. 1 by mouth every wk 10)  Lorcet Plus 7.5-650 Mg Tabs (Hydrocodone-acetaminophen) .... One by mouth every 4 hours as needed for pain. will cause somnolence   Patient Instructions: 1)  rest  2)  Take 400 mg of Ibuprofen (Advil, Motrin) with food every 4 hours as needed  for relief of pain ; watch for stomach irritation 3)  if the pain continue, take hydrocodone, watch for somnolence 4)  Call if not better in 2 weeks Prescriptions: LORCET PLUS 7.5-650 MG TABS (HYDROCODONE-ACETAMINOPHEN) one by mouth every 4 hours as needed for pain. Will cause somnolence  #40 x 0   Entered and Authorized by:   Nolon Rod. Paz MD   Signed by:   Nolon Rod. Paz MD on 11/04/2009   Method used:   Print then Give to Patient   RxID:   [id number]

## 2010-03-16 NOTE — Miscellaneous (Signed)
  call from lab - pt never returned ifob kit [facility]  June 01, 2009 2:58 PM Send a letter Elita Quick E. Ismar Yabut MD  June 02, 2009 5:35 PM  Clinical Lists Changes  Ms. Crute,  Please return the stool kit that was provided to you at your last office visit.  You can return it in the evelope that was provided with the kit.  Please call our office with any concerns. Shary Decamp, CMA  June 03, 2009 1:35 PM

## 2010-03-16 NOTE — Assessment & Plan Note (Signed)
Summary: THRUSH ? FOR 3-4 WEEKS////SPH   Vital Signs:  Patient profile:   60 year old female Weight:      183.50 pounds Temp:     97.0 degrees F oral Pulse rate:   72 / minute Pulse rhythm:   regular BP sitting:   124 / 86  (left arm) Cuff size:   regular  Vitals Entered By: Army Fossa CMA (August 11, 2009 9:03 AM) CC: April Little here for possible thrush in her month x 3-4 weeks   History of Present Illness: 2 months history of thrush --- dry mouth, bad taste and white lesions also her asthma is not well controlled She ran out of Singulair She has been taking Advair on p.r.n. basis, in the last few weeks she has taken it seldom  ROS No recent antibiotics Her last A1c was approximately 8 Previously could not tolerate the  "liquid medicine for thrush"   Allergies (verified): No Known Drug Allergies  Past History:  Past Medical History: Reviewed history from 05/12/2009 and no changes required. Diabetes mellitus, type I-- f/u at [facility] Asthma Pneumonia, hx of 2008 menopause onset : aprox at age 89 Hyperlipidemia  Past Surgical History: Reviewed history from 05/12/2009 and no changes required. G2 P2;  Conization when she was on her  6s  Social History: Reviewed history from 05/12/2009 and no changes required. Married 2 children son with Cerebral Palsy tobacco-- quit many years ago ETOH-- rarely diet-- ok exercise-- tennis , golf  Review of Systems      See HPI  Physical Exam  General:  alert and well-developed.   Mouth:  few-small amount of white cotton- like discharge in the gums and sides of the tongue Lungs:  normal respiratory effort and no intercostal retractions.  mild wheezing, no rhonchi, no crackles   Impression & Recommendations:  Problem # 1:  THRUSH (ICD-112.0) prescribed fluconazole, poor tolerance to nystatin  Problem # 2:  ASTHMA (ICD-493.90) not well controlled lately, see history of present illness Samples of Singulair and  Advair Recommend to go back to daily Singulair Recommend to use Advair daily, to prevent thrush she like to rinse her mouth after each use  will call if no better  Her updated medication list for this problem includes:    Proventil Hfa 108 (90 Base) Mcg/act Aers (Albuterol sulfate) ..... Inhale 2 puff as directed four times a day    Advair Diskus 250-50 Mcg/dose Misc (Fluticasone-salmeterol) ..... Inhale 1 puff as directed twice a day    Singulair 10 Mg Tabs (Montelukast sodium) ..... One tablet by mouth daily  Complete Medication List: 1)  Proventil Hfa 108 (90 Base) Mcg/act Aers (Albuterol sulfate) .... Inhale 2 puff as directed four times a day 2)  Accu-chek Spirit Insulin Pump Kit (Insulin infusion pump) 3)  Advair Diskus 250-50 Mcg/dose Misc (Fluticasone-salmeterol) .... Inhale 1 puff as directed twice a day 4)  Onetouch Ultra Test Strp (Glucose blood) .... Use 1 strip five times a day 5)  Bd Ultra-fine Pen Needles 29g X 12.51mm Misc (Insulin pen needle) .... Use one needle as directed. 6)  Allegra 180 Mg Tabs (Fexofenadine hcl) 7)  Nasonex 50 Mcg/act Susp (Mometasone furoate) .... Two sprays each nostril daily 8)  Singulair 10 Mg Tabs (Montelukast sodium) .... One tablet by mouth daily 9)  Simvastatin 5 Mg Tabs (Simvastatin) .Marland Kitchen.. 1 by mouth at bedtime - per endo 10)  Alendronate Sodium 70 Mg Tabs (Alendronate sodium) .Marland Kitchen.. 1 by mouth every wk 11)  Fluconazole  100 Mg Tabs (Fluconazole) .... One by mouth daily for one week Prescriptions: SINGULAIR 10 MG TABS (MONTELUKAST SODIUM) one tablet by mouth daily  #90 x 3   Entered and Authorized by:   Nolon Rod. Myeesha Shane MD   Signed by:   Nolon Rod. Blu Lori MD on 08/11/2009   Method used:   Print then Give to Patient   RxID:   [id number] FLUCONAZOLE 100 MG TABS (FLUCONAZOLE) one by mouth daily for one week  #7 x 0   Entered and Authorized by:   Nolon Rod. Ansley Stanwood MD   Signed by:   Nolon Rod. Jiya Kissinger MD on 08/11/2009   Method used:   Electronically to         [id number]* (retail)       [address]       [place], [place]  [address]       Ph: [phone]       Fax: [phone]   RxID:   [id number]

## 2010-03-16 NOTE — Progress Notes (Signed)
Summary: nos appt  Phone Note Call from Patient   Caller: [email or web address]  Call For: wright Summary of Call: Rsc nos from 11/22 to 02/16/2010. Initial call taken by: Darletta Moll,  January 06, 2010 10:27 AM

## 2010-03-18 NOTE — Miscellaneous (Signed)
Summary: Letter  Clinical Lists Changes To whom it may concern:   Ms April Little  is rated 4.0 in tennis but due to asthma and allergies please adjust this season to 3.5 level.   Please take this under advisement.    Sincerely  yours      Shan Levans MD

## 2010-03-18 NOTE — Assessment & Plan Note (Signed)
Summary: Pulmonary OV   Copy to:  Dr. Willow Ora Primary Provider/Referring Provider:  Dr. Willow Ora  CC:  3 month follow up.  Pt states this is always a bad time of the year for her.  Having SOB more frequently when going up stairs.  Prod cough but does not cough mucus out.Marland Kitchen  History of Present Illness: Pulmonary Consultation       This is a 60 year old female who presents with asthma.  The patient complains of history of diagnosed Asthma, cough, shortness of breath, chest tightness, and wheezing, but denies chest pain, mucous production, nocturnal awakening, exercise induced symptoms, and congestion.  Symptoms appear triggered by exercise, allergen:, and irritant:.  The patient also has the following associated problems: allergic rhinitis, nasal congestion, difficulty breathing through nose, productive cough, chest pain, and chest tightness.  Previous effective treatment includes short acting beta-agonist:, ICS + LABA, leukotriene agonist, and oral steroids.  Asthma monitoring and treatment delivery to date has included: no home nebulizer and no PF meter.  Asthma severity risk factors include: ER visits in last year:.   THis pt has been Dx asthma for 65yrs The asthma is is not seasonal The pt uses albuterol once daily and ave twic e in one week The past allergy testing rast positive Grasses and trees The pt rx advair and albuterol and singulair and allegra is daily The pt only uses advair as needed  The dyspnea comes and goes The mucus is thick and yellow  February 16, 2010 4:14 PM Now having more symptoms winded up flight of stairs.  When using advair or PFM.  will cause pt to cough.  Has special needs son.  Has to run up stairs. PFR: 350s now,  best ever 450 -500 insulin pump 1.7mg /hr insulin,  CBGs lately 200s   Asthma History    Asthma Control Assessment:    Age range: 12+ years    Symptoms: >2 days/week    Nighttime Awakenings: 0-2/month    Interferes w/ normal activity: no  limitations    SABA use (not for EIB): 0-2 days/week    ATAQ questionnaire: 0    FEV1 Pred: 2.975 liters (today)    Exacerbations requiring oral systemic steroids: 0-1/year    Asthma Control Assessment: Not Well Controlled    Current Medications (verified): 1)  Proventil Hfa 108 (90 Base) Mcg/act Aers (Albuterol Sulfate) .... Inhale 2 Puff Four Times A Day As Needed 2)  Accu-Chek Spirit Insulin Pump  Kit (Insulin Infusion Pump) .... 1.7 Units/hr 3)  Advair Diskus 250-50 Mcg/dose Misc (Fluticasone-Salmeterol) .... Inhale 1 Puff As Directed Twice A Day 4)  Onetouch Ultra Test  Strp (Glucose Blood) .... Use 1 Strip Five Times A Day 5)  Bd Ultra-Fine Pen Needles 29g X 12.41mm  Misc (Insulin Pen Needle) .... Use One Needle As Directed. 6)  Allegra 180 Mg Tabs (Fexofenadine Hcl) .... Take 1 Tablet By Mouth Once A Day 7)  Singulair 10 Mg Tabs (Montelukast Sodium) .... One Tablet By Mouth Daily 8)  Simvastatin 5 Mg Tabs (Simvastatin) .Marland Kitchen.. 1 By Mouth At Bedtime - Per Endo 9)  Alendronate Sodium 70 Mg Tabs (Alendronate Sodium) .Marland Kitchen.. 1 By Mouth Every Wk 10)  Apidra 100 Unit/ml Soln (Insulin Glulisine) .... As Directed 11)  Meloxicam 15 Mg Tabs (Meloxicam) .... As Needed  Allergies (verified): No Known Drug Allergies  Past History:  Past medical, surgical, family and social histories (including risk factors) reviewed, and no changes noted (except as noted  below).  Past Medical History: Reviewed history from 05/12/2009 and no changes required. Diabetes mellitus, type I-- f/u at [facility] Asthma Pneumonia, hx of 2008 menopause onset : aprox at age 20 Hyperlipidemia  Past Surgical History: Reviewed history from 05/12/2009 and no changes required. G2 P2;  Conization when she was on her  29s  Family History: Reviewed history from 12/07/2009 and no changes required. M - deceased, complications due to DM, liver Failure, heart disease F - deceased, cirrhosis, +ETOH DM - M, bro brother - heart  disease CAD - M  family, 2 B  (early in life) stroke - no colon Ca - no breast Ca - no  Social History: Reviewed history from 12/07/2009 and no changes required. Married 2 children son with Cerebral Palsy tobacco-- quit in 1980.  3 ppd x 10 yrs ETOH-- rarely diet-- ok exercise-- tennis , golf  Review of Systems       The patient complains of shortness of breath with activity.  The patient denies shortness of breath at rest, productive cough, non-productive cough, coughing up blood, chest pain, irregular heartbeats, acid heartburn, indigestion, loss of appetite, weight change, abdominal pain, difficulty swallowing, sore throat, tooth/dental problems, headaches, nasal congestion/difficulty breathing through nose, sneezing, itching, ear ache, anxiety, depression, hand/feet swelling, joint stiffness or pain, rash, change in color of mucus, and fever.    Vital Signs:  Patient profile:   60 year old female Height:      68 inches Weight:      189.13 pounds BMI:     28.86 O2 Sat:      96 % on Room air Temp:     97.7 degrees F oral Pulse rate:   84 / minute BP sitting:   136 / 82  (right arm) Cuff size:   regular  Vitals Entered By: Gweneth Dimitri RN (February 16, 2010 4:01 PM)  O2 Flow:  Room air CC: 3 month follow up.  Pt states this is always a bad time of the year for her.  Having SOB more frequently when going up stairs.  Prod cough but does not cough mucus out. Comments Medications reviewed with patient Daytime contact number verified with patient. Gweneth Dimitri RN  February 16, 2010 4:01 PM    Physical Exam  Additional Exam:  Gen: Pleasant, well-nourished, in no distress,  normal affect ENT: No lesions,  mouth clear,  oropharynx clear, no postnasal drip Neck: No JVD, no TMG, no carotid bruits Lungs: No use of accessory muscles, no dullness to percussion, distant bs, few exp wheeze Cardiovascular: RRR, heart sounds normal, no murmur or gallops, no peripheral edema Abdomen:  soft and NT, no HSM,  BS normal Musculoskeletal: No deformities, no cyanosis or clubbing Neuro: alert, non focal Skin: Warm, no lesions or rashes    Pre-Spirometry FEV1    Pred: 2.975 L     Impression & Recommendations:  Problem # 1:  ASTHMA (ICD-493.90) Assessment Unchanged Severe persistent asthma IgE levels low  plan trial dulera 200 two puff two times a day as needed saba pt given new asthma action plan  Medications Added to Medication List This Visit: 1)  Proventil Hfa 108 (90 Base) Mcg/act Aers (Albuterol sulfate) .... Inhale 2 puff four times a day as needed 2)  Dulera 200-5 Mcg/act Aero (Mometasone furo-formoterol fum) .... 2 puffs twice per day failed advair  Complete Medication List: 1)  Proventil Hfa 108 (90 Base) Mcg/act Aers (Albuterol sulfate) .... Inhale 2 puff four times a  day as needed 2)  Accu-chek Spirit Insulin Pump Kit (Insulin infusion pump) .... 1.7 units/hr 3)  Dulera 200-5 Mcg/act Aero (Mometasone furo-formoterol fum) .... 2 puffs twice per day failed advair 4)  Onetouch Ultra Test Strp (Glucose blood) .... Use 1 strip five times a day 5)  Bd Ultra-fine Pen Needles 29g X 12.19mm Misc (Insulin pen needle) .... Use one needle as directed. 6)  Allegra 180 Mg Tabs (Fexofenadine hcl) .... Take 1 tablet by mouth once a day 7)  Singulair 10 Mg Tabs (Montelukast sodium) .... One tablet by mouth daily 8)  Simvastatin 5 Mg Tabs (Simvastatin) .Marland Kitchen.. 1 by mouth at bedtime - per endo 9)  Alendronate Sodium 70 Mg Tabs (Alendronate sodium) .Marland Kitchen.. 1 by mouth every wk 10)  Apidra 100 Unit/ml Soln (Insulin glulisine) .... As directed 11)  Meloxicam 15 Mg Tabs (Meloxicam) .... As needed  Other Orders: Est. Patient Level IV (11914)  Asthma Management Plan    Asthma Severity: Moderate Persistent    Control Assessment: Not Well Controlled    Personal best PEF: 500 liters/minute    Plan based on PEF formula: [employer] [employer] Zone: (Range: 450 to 300) DULERA 200-5  MCG/ACT AERO:  2 puffs every 12 hours SINGULAIR 10 MG TABS:  1 tablet daily  Yellow Zone: DULERA 200-5 MCG/ACT AERO:  2 puffs every 12 hours PROVENTIL HFA 108 (90 BASE) MCG/ACT AERS:  2 puffs every 3-4 hours (yellow zone)  Red Zone: Call your physician for shortness of breath.   PROVENTIL HFA 108 (90 BASE) MCG/ACT AERS:  2 puffs every 20 minutes x 3 doses as needed for acute flare  Patient Instructions: 1)  Stop Advair 2)  Start Dulera 200 two puff twice daily 3)  Call in two weeks with peak flow rate readings and symptoms 4)  Return 2 months Prescriptions: DULERA 200-5 MCG/ACT AERO (MOMETASONE FURO-FORMOTEROL FUM) 2 puffs twice per day Failed advair  #1 x 6   Entered and Authorized by:   Storm Frisk MD   Signed by:   Storm Frisk MD on 02/16/2010   Method used:   Electronically to        [facility] [phone]* (retail)       [address]       [place], [place]  [address]       Ph: [phone]       Fax: [phone]   RxID:   [id number]

## 2010-05-14 ENCOUNTER — Encounter: Payer: Self-pay | Admitting: Internal Medicine

## 2010-06-29 NOTE — Discharge Summary (Signed)
April [name], April               ACCOUNT NO.:  [id number]   MEDICAL RECORD NO.:  [id number]          PATIENT TYPE:  INP   LOCATION:  1612                         FACILITY:  [facility]   PHYSICIAN:  April Gess. Norins, MD  DATE OF BIRTH:  [birth year]   DATE OF ADMISSION:  01/28/2007  DATE OF DISCHARGE:  01/31/2007                               DISCHARGE SUMMARY   ADMITTING DIAGNOSES:  1. Diabetic ketoacidosis.  2. Leukocytosis with infection.  3. Acute renal failure secondary to dehydration.   DISCHARGE DIAGNOSES:  1. Diabetic ketoacidosis resolved.  2. Acute renal failure resolved.  3. Upper respiratory infection with normal sensation of leukocytosis.   CONSULTANTS:  None.   PROCEDURES:  Chest x-ray January 28, 2007 with no active disease.  Chest x-ray January 30, 2007 with no active disease.   HISTORY OF PRESENT ILLNESS:  The patient is a 60 year old insulin pump  dependent diabetic and asthmatic who had been feeling weak and ill for  approximately a week complaining of malaise.  She had noted that her  blood sugars have become more difficult to control.  She complained of  myalgias, abdominal discomfort, nausea, vomiting, and cough productive  of a brownish sputum.  She presented to the emergency department for  evaluation where she was found to have a leukocytosis of 23,000 with 91%  PMN.  BUN 23, creatinine 1.4, glucose 488.  Because of concern for  ketoacidosis the patient was started on Glucomander, IV fluids, IV  antibiotics, and admitted to hospital.   Past Medical History, Family History, Social History, and admitting exam  documented in the admission note.   HOSPITAL COURSE:  1. Diabetic ketoacidosis.  The patient was started on Glucomander for      approximately 12-18 hours, and then this was discontinued.  The      patient did develop a problem with hypoglycemia and was put on      glucose control.  On December 16 she was restarted on her insulin      pump, and  q.4 CBG's were checked.  The patient had several episodes      of asymptomatic hypoglycemia.  She, otherwise, was taking a diet,      doing well, and seemed to have her diabetes under better control.      At this point with the DKA resolved, she is ready for discharge      home.  2. ID.  Patient with a leukocytosis this admission.  The patient was      started on IV Avelox.  Her white count trended down from 22,700 to      15,600 to 8400 at the time of discharge.  She was on p.o. Avelox.      Chest x-rays were negative.  Suspect she either had an upper      respiratory infection or significant bronchitis favoring the      former.  With the patient being afebrile and with her leukocytosis      resolved, she is thought to be stable and ready for discharge home      to complete  a course of p.o. antibiotics.   DISCHARGE PHYSICAL EXAMINATION:  VITAL SIGNS:  Temperature 98.2, blood  pressure 135/75, heart rate 22, respirations 18, O2 sats 95%.  GENERAL:  The patient is sitting at the window seat in her room working  on her computer.  She is bright, alert, and in good spirits.  She has no  respiratory difficulty with no increased work of breathing, a lot of  wheezing.   FINAL LABORATORY DATA:  Hemoglobin 10.9 g, white count 8400, platelet  count 246,000.  Sodium 141, potassium 3.4, chloride 106, CO2 29, BUN 9,  creatinine 0.8, glucose 49.   DISPOSITION:  The patient is discharged home.   DISCHARGE MEDICATIONS:  The patient will resume all her home  medications.  She will be on Avelox 400 mg daily for an additional five  days.   The patient is carefully instructed if she develops difficult to control  blood sugars or any mild symptoms of illness she should seek prompt  medical attention to avoid uncontrolled infection.   CONDITION AT TIME OF DISCHARGE:  Stable and improved.   FOLLOWUP:  She will follow up with Dr. Leanne [name] as needed.      April Gess Norins, MD  Electronically  Signed     MEN/MEDQ  D:  01/31/2007  T:  01/31/2007  Job:  [id number]   cc:   April April [name], M.D.  Fax: [phone]

## 2010-06-29 NOTE — H&P (Signed)
April [name], April [name]               ACCOUNT NO.:  [id number]   MEDICAL RECORD NO.:  [id number]         PATIENT TYPE:  LINP   LOCATION:                               FACILITY:  [facility]   PHYSICIAN:  Hollice Espy, M.D.DATE OF BIRTH:  1950/07/01   DATE OF ADMISSION:  01/28/2007  DATE OF DISCHARGE:                              HISTORY & PHYSICAL   PRIMARY CARE PHYSICIAN:  Leanne Chang, M.D.   CHIEF COMPLAINT:  Nausea, vomiting, and shortness of breath.   HISTORY OF PRESENT ILLNESS:  The patient is a 60 year old white female  with a past medical  history of type 2 diabetes mellitus on insulin pump  for the past 3 years as well as asthma who for the last several days has  been feeling very weak, complaining of malaise, and felt that her asthma  was acting up and that she was a bit more short of breath and wheezing.  She continued her insulin Glucomander, but today she started having  complaints of just aches, pains,  abdominal pain, nausea, vomiting, as  well as a cough with some brownish sputum.  She came into the emergency  room for evaluation.  She was found to have a white count of 23 with a  91% shift.  The rest of her labs were notable for BUN of 23, creatinine  of 1.4, glucose of 488, and a negative UA.  ER attending was concerned  about the possibility of infection, although chest x-ray was not done.  The patient was started on insulin Glucomander and IV fluids.  She is  starting to feel a little bit better but still very weak.   REVIEW OF SYSTEMS:  She notes a mild headache.  No vision changes, no  dysphagia, no chest pain or palpitations.  She did note some early on  wheezing but no shortness of breath,  and she noted a productive cough  with brown sputum.  She has some abdominal pain.  She also complains of  sore throat and some nausea, no vomiting.  No hematuria, but she has  noted frequent urination.  No constipation, diarrhea.  No focal  extremity numbness, weakness,  or pain.  Review of Systems otherwise  negative.   PAST MEDICAL HISTORY:  1. Diabetes mellitus, type 2, on insulin pump.  2. Asthma.   MEDICATIONS:  1. Asthma inhalers.  2. Insulin pump with continuous insulin.   ALLERGIES:  No known drug allergies.   SOCIAL HISTORY:  She denies any tobacco, alcohol, or drug use.   FAMILY HISTORY:  Noncontributory.   PHYSICAL EXAMINATION:  VITAL SIGNS:  On admission, temperature 98.5,  heart rate 88 when lying down, up to as high as 120 when sitting up.  Blood pressure 132/84, respirations 20, O2 saturation 98% on room air.  GENERAL:  She is alert and oriented x3.  Some  mild distress secondary  to nausea.  HEENT:  Normocephalic and atraumatic.  Mucous membranes are dry.  NECK:  No carotid bruits.  HEART:  Irregular rhythm, mild tachycardia.  LUNGS:  Decreased breath sounds at the bases.  ABDOMEN:  Soft.  Some generalized nonspecific mild tenderness.  Hypoactive bowel sounds.  EXTREMITIES:  Show no clubbing, cyanosis, or edema.   LABORATORY WORK:  White count 23, hemoglobin 13.8, hematocrit 41, MCV  84, platelets 370, 91% shift.   Chest x-ray not done.  I have ordered it.   Sodium 138, potassium 5, chloride 102, bicarb 14, BUN 23, creatinine  1.4, glucose 488.  UA: Greater than 1000 glucose, greater than 80  ketones.   ASSESSMENT AND PLAN:  1. Diabetic ketoacidosis.  Will plan to put the patient on insulin      Glucomander, IV fluids, n.p.o.  Check a BMET q. 4 h.  2. Acute renal failure secondary to dehydration.  Will aggressively      hydrate.  3. Infection, suspect pneumonia given asthmatic symptoms.  Will check      chest x-ray and treat with IV antibiotics given shortness of breath      symptoms and productive cough.      Hollice Espy, M.D.  Electronically Signed     SKK/MEDQ  D:  01/28/2007  T:  01/28/2007  Job:  [id number]   cc:   Leanne Chang, M.D.  Fax: [phone]

## 2010-08-11 ENCOUNTER — Ambulatory Visit: Payer: Self-pay | Admitting: Critical Care Medicine

## 2010-08-11 ENCOUNTER — Encounter: Payer: Self-pay | Admitting: Critical Care Medicine

## 2010-08-13 ENCOUNTER — Ambulatory Visit (INDEPENDENT_AMBULATORY_CARE_PROVIDER_SITE_OTHER): Payer: BC Managed Care – PPO | Admitting: Critical Care Medicine

## 2010-08-13 ENCOUNTER — Encounter: Payer: Self-pay | Admitting: Critical Care Medicine

## 2010-08-13 DIAGNOSIS — J209 Acute bronchitis, unspecified: Secondary | ICD-10-CM

## 2010-08-13 DIAGNOSIS — B3781 Candidal esophagitis: Secondary | ICD-10-CM

## 2010-08-13 DIAGNOSIS — B370 Candidal stomatitis: Secondary | ICD-10-CM

## 2010-08-13 DIAGNOSIS — J45909 Unspecified asthma, uncomplicated: Secondary | ICD-10-CM

## 2010-08-13 MED ORDER — MOMETASONE FURO-FORMOTEROL FUM 200-5 MCG/ACT IN AERO: 2.0000 | INHALATION_SPRAY | Freq: Two times a day (BID) | RESPIRATORY_TRACT | Status: DC

## 2010-08-13 MED ORDER — AZITHROMYCIN 250 MG PO TABS: ORAL_TABLET | ORAL | Status: DC

## 2010-08-13 MED ORDER — MAGIC MOUTHWASH: 10.0000 mL | Freq: Three times a day (TID) | ORAL | Status: DC | PRN

## 2010-08-13 NOTE — Progress Notes (Signed)
Subjective:    Patient ID: April Little, female    DOB: 1950/06/25, 60 y.o.   MRN: [id number]  HPI This is a 60 year old female who presents with asthma. The patient complains of history of diagnosed Asthma, cough, shortness of breath, chest tightness, and wheezing, but denies chest pain, mucous production, nocturnal awakening, exercise induced symptoms, and congestion. Symptoms appear triggered by exercise, allergen:, and irritant:. The patient also has the following associated problems: allergic rhinitis, nasal congestion, difficulty breathing through nose, productive cough, chest pain, and chest tightness. Previous effective treatment includes short acting beta-agonist:, ICS + LABA, leukotriene agonist, and oral steroids. Asthma monitoring and treatment delivery to date has included: no home nebulizer and no PF meter. Asthma severity risk factors include: ER visits in last year:.  THis pt has been Dx asthma for 49yrs  The asthma is is not seasonal  The pt uses albuterol once daily and ave twic e in one week  The past allergy testing rast positive Grasses and trees  The pt rx advair and albuterol and singulair and allegra is daily  The pt only uses advair as needed  The dyspnea comes and goes  The mucus is thick and yellow  February 16, 2010 4:14 PM  Now having more symptoms winded up flight of stairs. When using advair or PFM. will cause pt to cough. Has special needs son. Has to run up stairs.  PFR: 350s now, best ever 450 -500  insulin pump 1.7mg /hr insulin, CBGs lately 200s   08/13/2010 Hypoglycemic event over the past weekend,  Since then CBGs 60-180  Insulin pump 1.7/hr   PFR:  Ave 300.  Notes more wheezing.  More yellow mucus, no sinus drip.  No sinus pressure.     Review of Systems Constitutional:   No  weight loss, night sweats,  Fevers, chills, fatigue, lassitude. HEENT:   No headaches,  Difficulty swallowing,  Tooth/dental problems,  Sore throat,                No sneezing,  itching, ear ache, nasal congestion, post nasal drip,   CV:  No chest pain,  Orthopnea, PND, swelling in lower extremities, anasarca, dizziness, palpitations  GI  No heartburn, indigestion, abdominal pain, nausea, vomiting, diarrhea, change in bowel habits, loss of appetite  Resp: Notes  shortness of breath with exertion or at rest.  No excess mucus, no productive cough,  No non-productive cough,  No coughing up of blood.  No change in color of mucus.  No wheezing.  No chest wall deformity  Skin: no rash or lesions.  GU: no dysuria, change in color of urine, no urgency or frequency.  No flank pain.  MS:  No joint pain or swelling.  No decreased range of motion.  No back pain.  Psych:  No change in mood or affect. No depression or anxiety.  No memory loss.     Objective:   Physical Exam Filed Vitals:   08/13/10 1526  BP: 132/80  Pulse: 90  Temp: 98.4 F (36.9 C)  TempSrc: Oral  Height: 5\' 8"  (1.727 m)  Weight: 175 lb 6.4 oz (79.561 kg)  SpO2: 97%    Gen: Pleasant, well-nourished, in no distress,  normal affect  ENT: No lesions,  mouth clear,  oropharynx clear, no postnasal drip  Neck: No JVD, no TMG, no carotid bruits  Lungs: No use of accessory muscles, no dullness to percussion,  Exp wheezes  Cardiovascular: RRR, heart sounds normal, no murmur  or gallops, no peripheral edema  Abdomen: soft and NT, no HSM,  BS normal  Musculoskeletal: No deformities, no cyanosis or clubbing  Neuro: alert, non focal  Skin: Warm, no lesions or rashes       Assessment & Plan:   ASTHMA Asthma with flare , not using dulera properly, no evidence of oral thrush  Plan  magic mouthwash 10ml three times daily,  Gargle and expectorate Azithromycin 2 tabs once then 1 daily until gone Increase dulera to two puff twice daily , use spacer Return 4 months or sooner if worse or unimproving     Updated Medication List Outpatient Encounter Prescriptions as of 08/13/2010  Medication Sig  Dispense Refill  . albuterol (PROVENTIL HFA) 108 (90 BASE) MCG/ACT inhaler Inhale 2 puffs into the lungs every 6 (six) hours as needed.        Marland Kitchen aspirin 81 MG tablet Take 81 mg by mouth daily.        . fexofenadine (ALLEGRA) 180 MG tablet Take 180 mg by mouth daily.        Marland Kitchen glucose blood test strip by Other route as needed. Use as instructed       . Ibuprofen-Diphenhydramine Cit (ADVIL PM PO) Take 1 tablet by mouth daily.        . insulin glulisine (APIDRA) 100 UNIT/ML injection Inject into the skin. Use as directed       . Insulin Infusion Pump (ACCU-CHEK SPIRIT INSULIN PUMP) KIT by Does not apply route.        . Insulin Pen Needle (LITETOUCH PEN NEEDLES) 29G X 12.7MM MISC by Does not apply route.        . Mometasone Furo-Formoterol Fum (DULERA) 200-5 MCG/ACT AERO Inhale 2 puffs into the lungs 2 (two) times daily.      . montelukast (SINGULAIR) 10 MG tablet Take 10 mg by mouth at bedtime.        . simvastatin (ZOCOR) 5 MG tablet Take 5 mg by mouth at bedtime.        Marland Kitchen DISCONTD: Mometasone Furo-Formoterol Fum (DULERA) 200-5 MCG/ACT AERO Inhale 2 puffs into the lungs daily.       . Alum & Mag Hydroxide-Simeth (MAGIC MOUTHWASH) SOLN Take 10 mLs by mouth 3 (three) times daily as needed.  120 mL  1  . azithromycin (ZITHROMAX) 250 MG tablet Take two once then one daily  6 each  0  . DISCONTD: alendronate (FOSAMAX) 70 MG tablet Take 70 mg by mouth every 7 (seven) days. Take with a full glass of water on an empty stomach.       . DISCONTD: meloxicam (MOBIC) 15 MG tablet Take 15 mg by mouth daily.

## 2010-08-13 NOTE — Patient Instructions (Signed)
Try magic mouthwash 10ml three times daily,  Gargle and expectorate Azithromycin 2 tabs once then 1 daily until gone Increase dulera to two puff twice daily , use spacer Return 4 months or sooner if worse or unimproving

## 2010-08-15 NOTE — Assessment & Plan Note (Signed)
Asthma with flare , not using dulera properly, no evidence of oral thrush  Plan  magic mouthwash 10ml three times daily,  Gargle and expectorate Azithromycin 2 tabs once then 1 daily until gone Increase dulera to two puff twice daily , use spacer Return 4 months or sooner if worse or unimproving

## 2010-11-19 LAB — BASIC METABOLIC PANEL
BUN: 9
CO2: 17 — ABNORMAL LOW
CO2: 19
CO2: 23
CO2: 29
Calcium: 8.7
Chloride: 108
Chloride: 112
Chloride: 118 — ABNORMAL HIGH
GFR calc Af Amer: 59 — ABNORMAL LOW
GFR calc Af Amer: >60
GFR calc Af Amer: >60
GFR calc non Af Amer: 57 — ABNORMAL LOW
GFR calc non Af Amer: 57 — ABNORMAL LOW
GFR calc non Af Amer: >60
Glucose, Bld: 133 — ABNORMAL HIGH
Glucose, Bld: 194 — ABNORMAL HIGH
Glucose, Bld: 49 — ABNORMAL LOW
Glucose, Bld: 93
Potassium: 3.4 — ABNORMAL LOW
Potassium: 3.8
Potassium: 4
Potassium: 4.6
Sodium: 141
Sodium: 141
Sodium: 142
Sodium: 143

## 2010-11-19 LAB — CBC
HCT: 31.4 — ABNORMAL LOW
HCT: 31.6 — ABNORMAL LOW
Hemoglobin: 10.8 — ABNORMAL LOW
Hemoglobin: 10.9 — ABNORMAL LOW
MCHC: 34.3
MCHC: 34.5
Platelets: 246
RBC: 3.81 — ABNORMAL LOW
RDW: 13.9
RDW: 14.2

## 2010-11-22 LAB — COMPREHENSIVE METABOLIC PANEL
AST: 31
Albumin: 4.3
BUN: 23
Calcium: 9.5
Chloride: 102
Creatinine, Ser: 1.38 — ABNORMAL HIGH
GFR calc Af Amer: 48 — ABNORMAL LOW
Total Bilirubin: 0.9
Total Protein: 7.3

## 2010-11-22 LAB — CBC
HCT: 40.7
MCHC: 33.9
MCV: 83.4
Platelets: 370
RDW: 13.8

## 2010-11-22 LAB — URINALYSIS, ROUTINE W REFLEX MICROSCOPIC
Bilirubin Urine: NEGATIVE
Hgb urine dipstick: NEGATIVE
Ketones, ur: >80 — AB
Nitrite: NEGATIVE
Protein, ur: NEGATIVE
Specific Gravity, Urine: 1.024
Urobilinogen, UA: 0.2

## 2010-11-22 LAB — BASIC METABOLIC PANEL
BUN: 22
CO2: 12 — ABNORMAL LOW
Chloride: 115 — ABNORMAL HIGH
GFR calc Af Amer: 50 — ABNORMAL LOW
Potassium: 4.1

## 2010-11-22 LAB — DIFFERENTIAL
Basophils Absolute: 0.1
Lymphocytes Relative: 4 — ABNORMAL LOW
Lymphs Abs: 0.8
Monocytes Absolute: 1.2 — ABNORMAL HIGH
Neutro Abs: 20.5 — ABNORMAL HIGH

## 2010-12-07 ENCOUNTER — Encounter: Payer: Self-pay | Admitting: Internal Medicine

## 2010-12-07 ENCOUNTER — Ambulatory Visit (INDEPENDENT_AMBULATORY_CARE_PROVIDER_SITE_OTHER): Payer: BC Managed Care – PPO | Admitting: Internal Medicine

## 2010-12-07 VITALS — BP 126/80 | HR 83 | Ht 68.0 in | Wt 182.8 lb

## 2010-12-07 DIAGNOSIS — J45909 Unspecified asthma, uncomplicated: Secondary | ICD-10-CM

## 2010-12-07 DIAGNOSIS — E785 Hyperlipidemia, unspecified: Secondary | ICD-10-CM

## 2010-12-07 DIAGNOSIS — R011 Cardiac murmur, unspecified: Secondary | ICD-10-CM

## 2010-12-07 DIAGNOSIS — E109 Type 1 diabetes mellitus without complications: Secondary | ICD-10-CM

## 2010-12-07 DIAGNOSIS — Z23 Encounter for immunization: Secondary | ICD-10-CM

## 2010-12-07 MED ORDER — MOMETASONE FURO-FORMOTEROL FUM 200-5 MCG/ACT IN AERO: 2.0000 | INHALATION_SPRAY | Freq: Two times a day (BID) | RESPIRATORY_TRACT | Status: DC

## 2010-12-07 MED ORDER — MONTELUKAST SODIUM 10 MG PO TABS: 10.0000 mg | ORAL_TABLET | Freq: Every day | ORAL | Status: DC

## 2010-12-07 NOTE — Assessment & Plan Note (Addendum)
See HPI, lokes to see a local MD Referral done  Will request labs from [facility] Flu shot rec regular check by ophthalmology ! Was Rx lisinopril few months ago but not taking it; since her BP is normal I think that it was prescribed for renal protection, that was discussed with the patient. Since we are not doing labs today, will let endocrinology decide about that

## 2010-12-07 NOTE — Progress Notes (Signed)
  Subjective:    Patient ID: April Little, female    DOB: 02-28-1950, 60 y.o.   MRN: [id number]  HPI Diabetes--admitted twice on 09-2010 [facility]) and [phone] [facility]) with  DKA. At the second admission the patient and the diabetes educator found out that the pump needle was malfunctioning (per pt) , they switch to a metal needle and since then she is doing well. Asthma--needs refills, currently not taking dulera  but knows she needs to start. High cholesterol--taken simvastatin 5 mg daily. No fasting today. Also complaining of back pain for a month, no radiation, no paresthesias, occasionally takes meloxicam   Past Medical History  Diagnosis Date  . Diabetes type I     on a pump , f/u a [facility]  . Asthma   . Hyperlipidemia   . Menopause     age 28   Past Surgical History  Procedure Date  . Cervical cone biopsy     in her 28s    Review of Systems Denies any chest pain or shortness of breath, no palpitation no lower extremity edema. Was prescribed lisinopril at [facility] but not sure why     Objective:   Physical Exam  Constitutional: She is oriented to person, place, and time. She appears well-developed and well-nourished. No distress.  Cardiovascular: Normal rate, regular rhythm and normal heart sounds.        Very soft systolic murmur, better appreciated a the right parasternal area  Musculoskeletal: She exhibits no edema.        Gait and motor normal  Neurological: She is alert and oriented to person, place, and time.  Skin: She is not diaphoretic.  Psychiatric: She has a normal mood and affect. Her behavior is normal. Judgment and thought content normal.       Assessment & Plan:  Face-to-face more than 25 minutes due to lengthy discussion about the 2 recent admissions to the hospital. I also review the 2 visits notes from endocrinology.

## 2010-12-07 NOTE — Assessment & Plan Note (Signed)
On zocor 5 mg as Rx by endocrinology Will get records from [facility]

## 2010-12-07 NOTE — Assessment & Plan Note (Signed)
New finding although was told about a murmur during last hospitalization at [facility] , reports she had a heart test (?ECHO) Plan: Get records

## 2010-12-07 NOTE — Patient Instructions (Signed)
Get records from [facility]: Labs , XRs, heart test from this year

## 2010-12-07 NOTE — Assessment & Plan Note (Signed)
Restart dulera, to be taking for few months a year (the month she knows sx will increase)

## 2011-10-28 ENCOUNTER — Other Ambulatory Visit: Payer: Self-pay | Admitting: Sports Medicine

## 2011-10-28 DIAGNOSIS — T1490XA Injury, unspecified, initial encounter: Secondary | ICD-10-CM

## 2011-10-28 DIAGNOSIS — M25562 Pain in left knee: Secondary | ICD-10-CM

## 2011-10-31 ENCOUNTER — Ambulatory Visit
Admission: RE | Admit: 2011-10-31 | Discharge: 2011-10-31 | Disposition: A | Discharge status: Home / Self Care | Payer: BC Managed Care – PPO | Source: Ambulatory Visit | Attending: Sports Medicine | Admitting: Sports Medicine

## 2011-10-31 DIAGNOSIS — T1490XA Injury, unspecified, initial encounter: Secondary | ICD-10-CM

## 2011-10-31 DIAGNOSIS — M25562 Pain in left knee: Secondary | ICD-10-CM

## 2011-11-16 HISTORY — PX: KNEE ARTHROSCOPY: SHX127

## 2012-01-04 ENCOUNTER — Ambulatory Visit (INDEPENDENT_AMBULATORY_CARE_PROVIDER_SITE_OTHER): Payer: BC Managed Care – PPO | Admitting: Internal Medicine

## 2012-01-04 ENCOUNTER — Encounter: Payer: Self-pay | Admitting: Internal Medicine

## 2012-01-04 VITALS — BP 136/78 | HR 87 | Temp 98.4°F | Wt 185.0 lb

## 2012-01-04 DIAGNOSIS — R011 Cardiac murmur, unspecified: Secondary | ICD-10-CM

## 2012-01-04 DIAGNOSIS — J45909 Unspecified asthma, uncomplicated: Secondary | ICD-10-CM

## 2012-01-04 DIAGNOSIS — E785 Hyperlipidemia, unspecified: Secondary | ICD-10-CM

## 2012-01-04 DIAGNOSIS — E109 Type 1 diabetes mellitus without complications: Secondary | ICD-10-CM

## 2012-01-04 MED ORDER — MOMETASONE FURO-FORMOTEROL FUM 200-5 MCG/ACT IN AERO: 2.0000 | INHALATION_SPRAY | Freq: Two times a day (BID) | RESPIRATORY_TRACT | Status: DC

## 2012-01-04 MED ORDER — DOXYCYCLINE HYCLATE 100 MG PO TABS: 100.0000 mg | ORAL_TABLET | Freq: Two times a day (BID) | ORAL | Status: DC

## 2012-01-04 MED ORDER — MONTELUKAST SODIUM 10 MG PO TABS: 10.0000 mg | ORAL_TABLET | Freq: Every day | ORAL | Status: DC

## 2012-01-04 NOTE — Assessment & Plan Note (Signed)
No murmur is appreciated today, I don't see a report form [facility] regards a ECHO

## 2012-01-04 NOTE — Assessment & Plan Note (Signed)
Not taking simvastatin, recommend routine office visit

## 2012-01-04 NOTE — Patient Instructions (Addendum)
Rest, fluids , tylenol For cough, take Mucinex DM twice a day x 1 week, then as needed  Dulera twice a day proventil 2 puffs 4 times a day x 3 days , then as needed  Take the antibiotic as prescribed  (doxycycline) Call if no better in few days Call anytime if the symptoms are severe Get a flu shot as soon as you feel better  --- Schedule a routine visit, fasting

## 2012-01-04 NOTE — Assessment & Plan Note (Addendum)
Presents with exacerbation of asthma. Haven't have a flu shot yet, strongly recommend her to do that. Will prescribe doxycycline for 10 days, see instructions. RF singulair

## 2012-01-04 NOTE — Assessment & Plan Note (Signed)
Per endocrinology, not taking ACE inhibitors "ran out"

## 2012-01-04 NOTE — Progress Notes (Signed)
  Subjective:    Patient ID: April Little, female    DOB: [birth year], 61 y.o.   MRN: [id number]  HPI Acute visit Symptoms started 5 days ago: Runny nose, increased cough, sore throat, mild headache. I haven't seen her in a year, we also talk about her cholesterol. Not taking simvastatin, and she ran out Before she got sick, her asthma was relatively well controlled, has not been using Proventil at all. Even now with increased cough she's not using Proventil. Reports uses dulera bid   Past Medical History  Diagnosis Date  . Diabetes type I     on a pump , f/u a [facility]  . Asthma   . Hyperlipidemia   . Menopause     age 60   Past Surgical History  Procedure Date  . Cervical cone biopsy     in her 95s  . Knee arthroscopy 11-16-2011    L   History   Social History  . Marital Status: Married    Spouse Name: N/A    Number of Children: 2  . 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 Smoking status: Former Smoker -- 3.0 packs/day for 12 years    Types: Cigarettes    Quit date: 02/15/1979  . Smokeless tobacco: Never Used     Comment: 3ppd x 10 years  . Alcohol Use: Yes     Comment: socially   . Drug Use: No  . Sexually Active: Not on file   Other Topics Concern  . Not on file   Social History Narrative  . No narrative on file     Review of Systems Having low-grade fever, occasional chills. No nausea, vomiting, diarrhea She does have some green sputum production and green nasal discharge. Does not feel like she is wheezing. She ran out of Singulair.    Objective:   Physical Exam General -- alert, well-developed, and well-nourished.   HEENT -- TMs normal, throat w/ mild redness, face symmetric and not tender to palpation, nose slt congested, she is hoarse  Lungs -- no increased work of breathing, wheezing mildly for about,  Heart-- normal rate, regular rhythm, no murmur, and no gallop.   Neurologic-- alert &  oriented X3 and strength normal in all extremities. Psych-- Cognition and judgment appear intact. Alert and cooperative with normal attention span and concentration.  not anxious appearing and not depressed appearing.        Assessment & Plan:  Paperwork for her driver's license provided, she also needs to have that checked by endocrinology and ophthalmology.  Advise pt to have ROVs, can't help if she comes back once a year for acute issues.  Today , I spent more than 25 min with the patient, >50% of the time counseling, and coordinating her care. See assessment and plan.

## 2012-02-09 ENCOUNTER — Encounter: Payer: BC Managed Care – PPO | Admitting: Internal Medicine

## 2012-02-25 IMAGING — CR DG CHEST 2V
2 series · 2 of 2 positions shown · non-contrast
Comparison: 01/30/2007

CLINICAL DATA: Shortness of breath.  Productive cough.  Wheezing.
Asthma.

CHEST - 2 VIEW

[w chest pa]
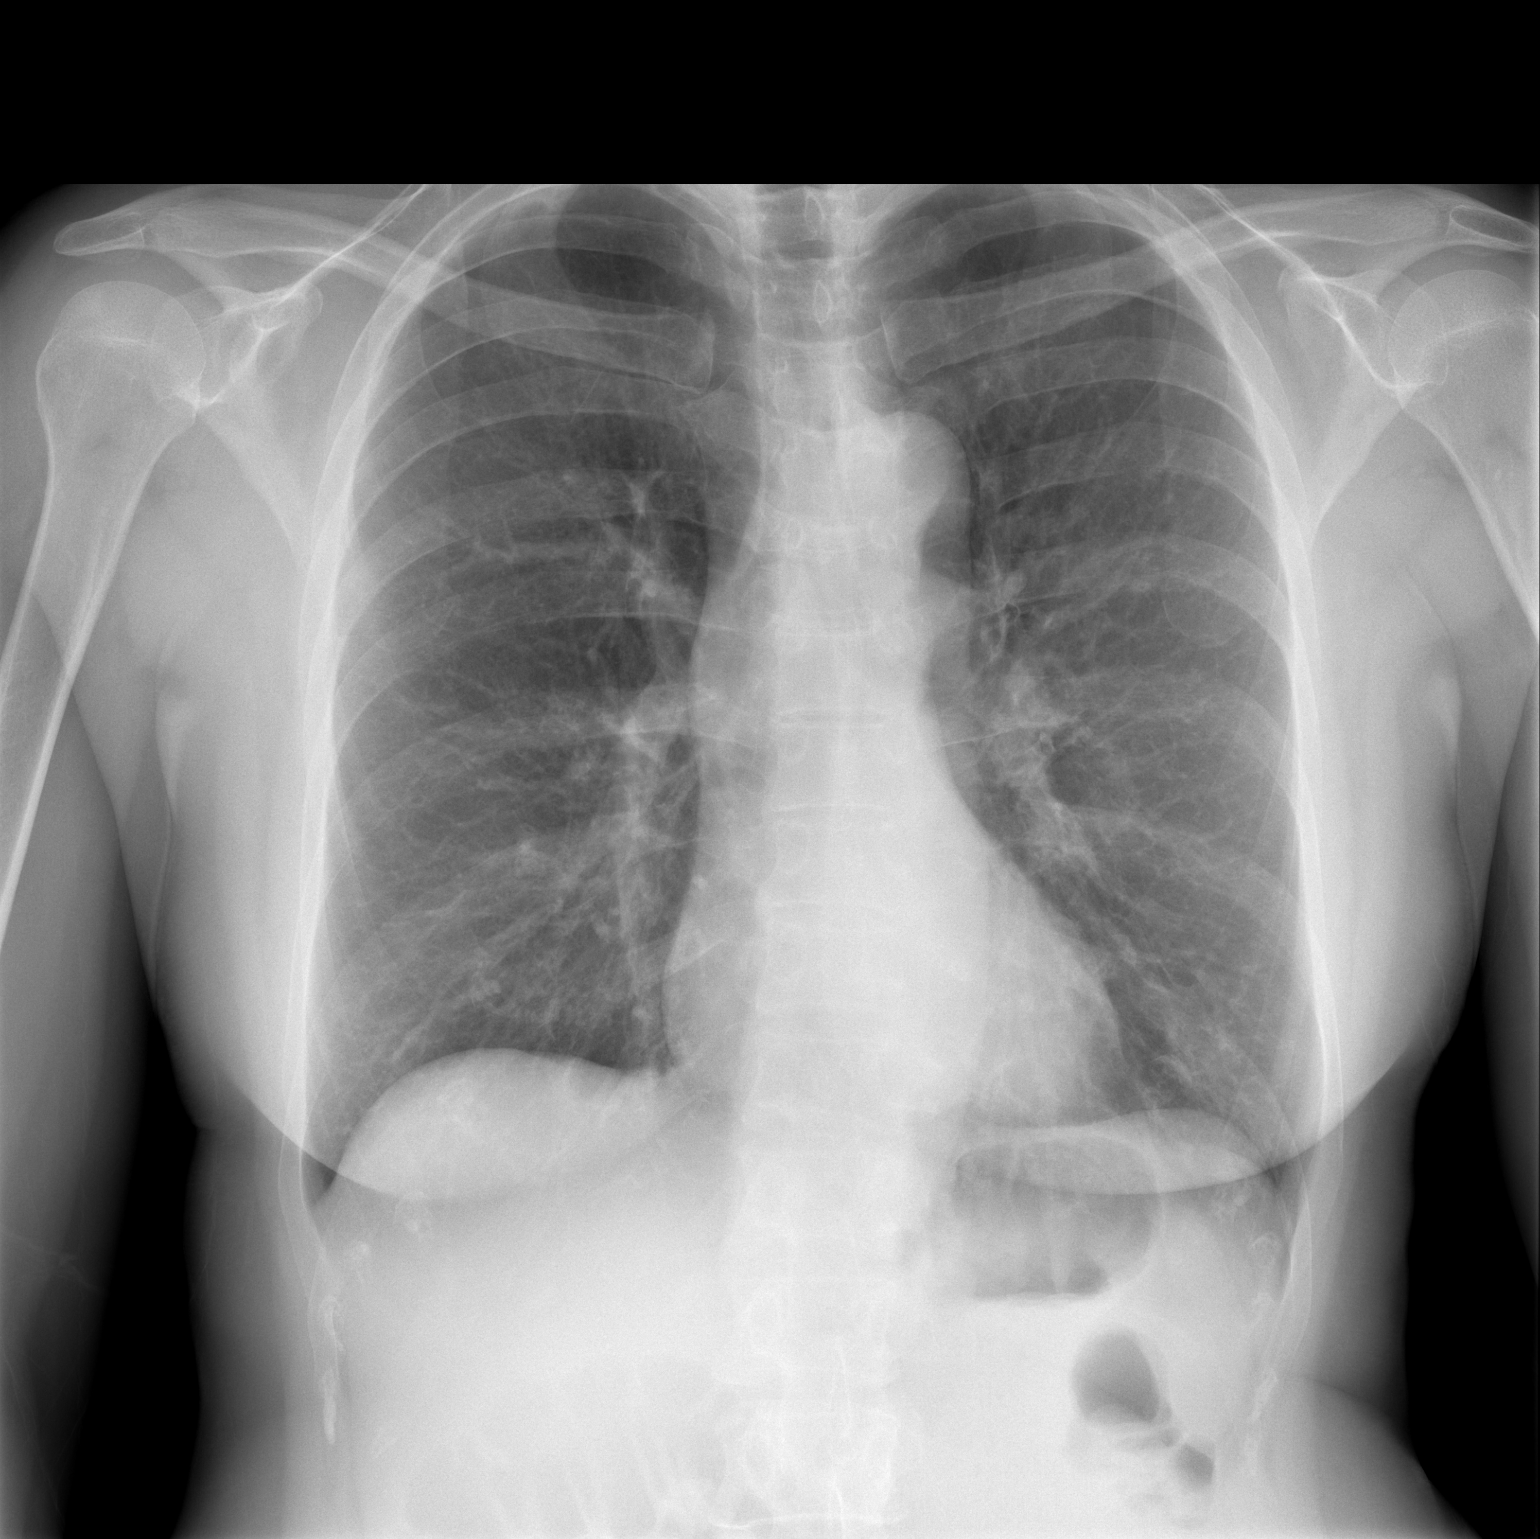

[w chest lat]
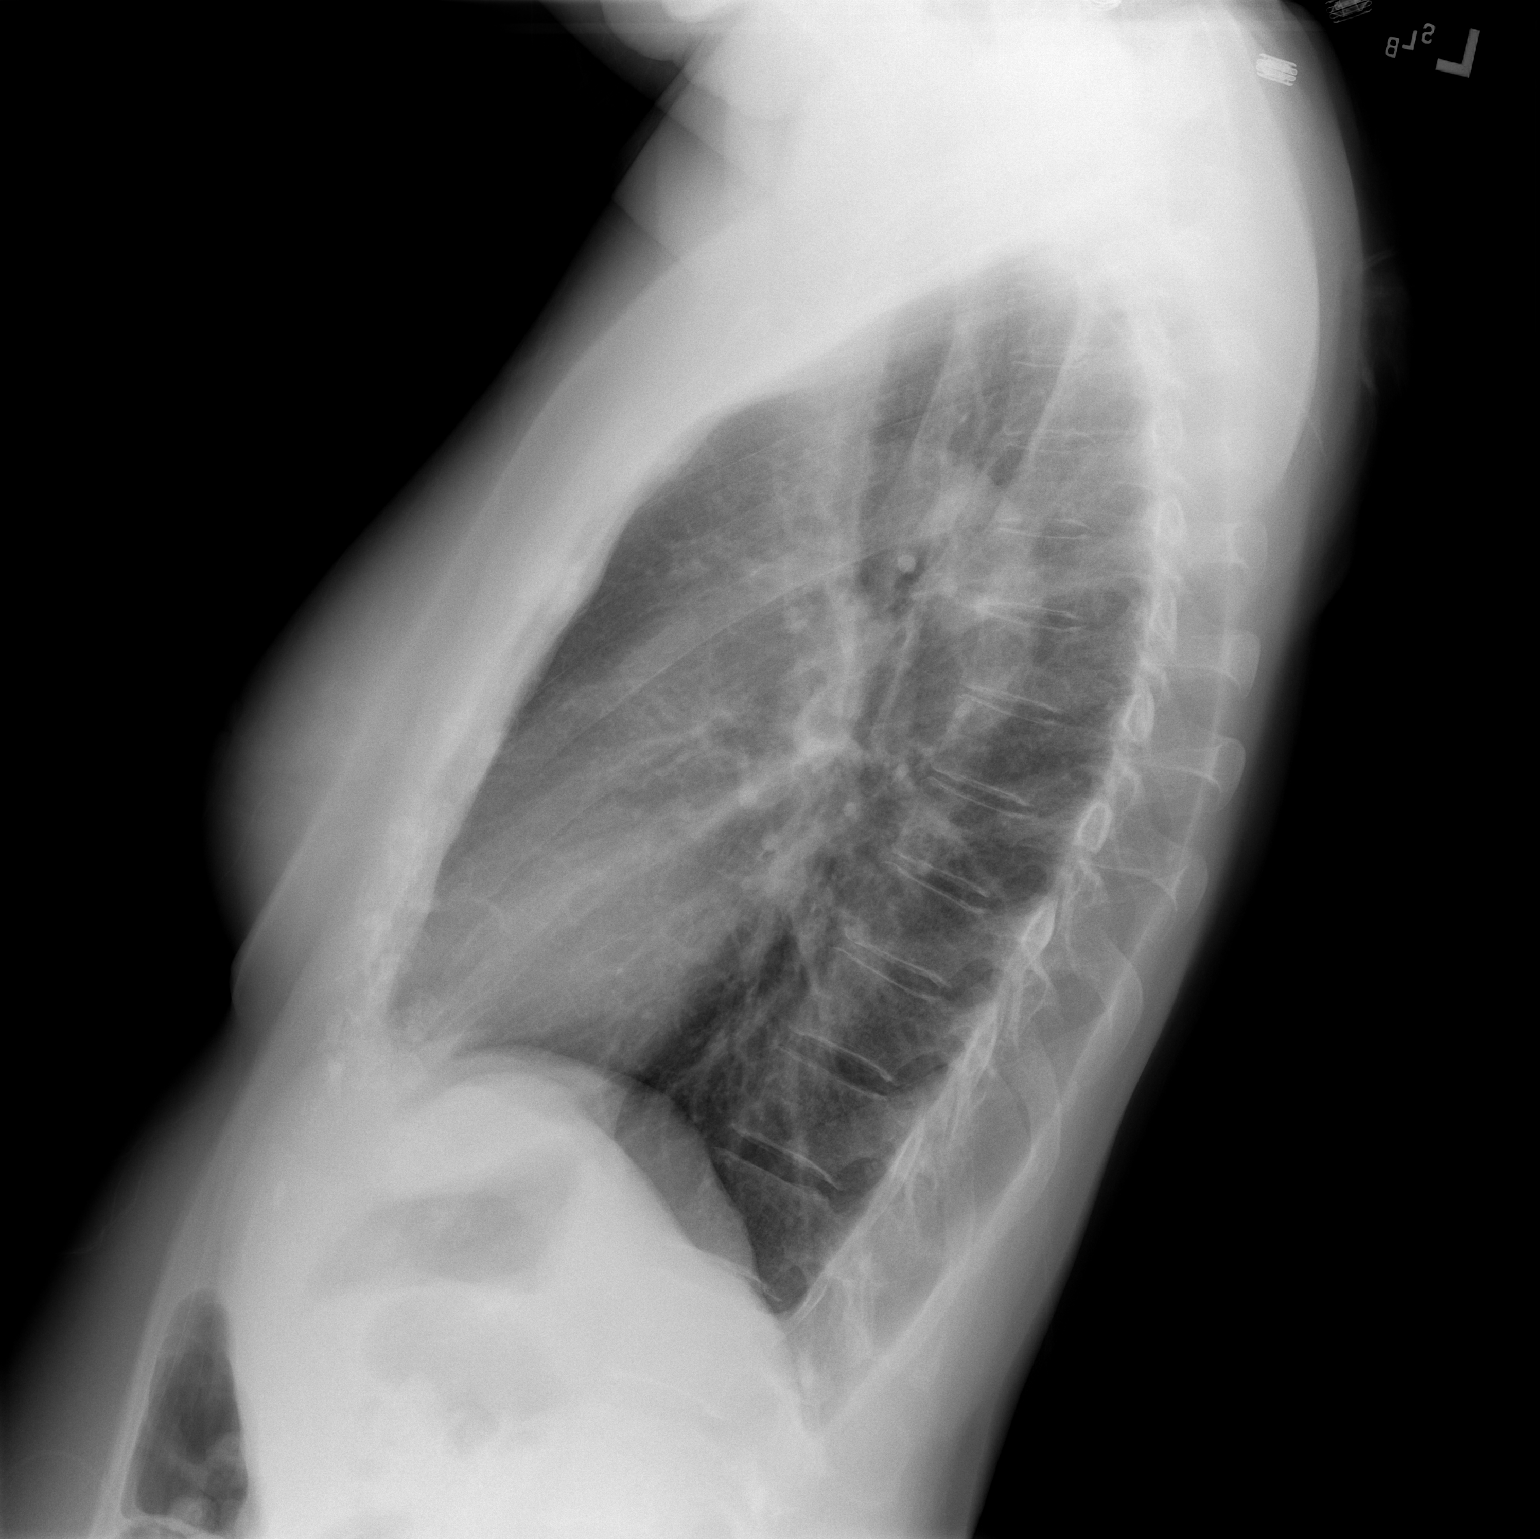

[2 of 2 positions shown; findings below may reference images not displayed]

FINDINGS: Heart size is normal.  Both lungs are clear.  No evidence
of pleural effusion.  No mass or adenopathy identified.
IMPRESSION: Stable exam.  No active disease.

## 2012-04-16 ENCOUNTER — Encounter: Payer: BC Managed Care – PPO | Admitting: Internal Medicine

## 2012-04-23 ENCOUNTER — Encounter: Payer: Self-pay | Admitting: Internal Medicine

## 2012-04-23 ENCOUNTER — Ambulatory Visit (INDEPENDENT_AMBULATORY_CARE_PROVIDER_SITE_OTHER): Payer: BC Managed Care – PPO | Admitting: Internal Medicine

## 2012-04-23 VITALS — BP 134/82 | HR 70 | Temp 98.3°F | Ht 68.0 in | Wt 174.2 lb

## 2012-04-23 DIAGNOSIS — E785 Hyperlipidemia, unspecified: Secondary | ICD-10-CM

## 2012-04-23 DIAGNOSIS — Z1231 Encounter for screening mammogram for malignant neoplasm of breast: Secondary | ICD-10-CM

## 2012-04-23 DIAGNOSIS — Z0000 Encounter for general adult medical examination without abnormal findings: Secondary | ICD-10-CM

## 2012-04-23 DIAGNOSIS — E119 Type 2 diabetes mellitus without complications: Secondary | ICD-10-CM

## 2012-04-23 LAB — CBC WITH DIFFERENTIAL/PLATELET
Eosinophils Relative: 14.6 % — ABNORMAL HIGH (ref 0.0–5.0)
Lymphocytes Relative: 22.5 % (ref 12.0–46.0)
Monocytes Relative: 8.8 % (ref 3.0–12.0)
Neutrophils Relative %: 52.8 % (ref 43.0–77.0)
Platelets: 273 10*3/uL (ref 150.0–400.0)
WBC: 6.6 10*3/uL (ref 4.5–10.5)

## 2012-04-23 LAB — COMPREHENSIVE METABOLIC PANEL
Albumin: 4 g/dL (ref 3.5–5.2)
BUN: 15 mg/dL (ref 6–23)
CO2: 27 mEq/L (ref 19–32)
GFR: 80.72 mL/min (ref >60.00)
Glucose, Bld: 133 mg/dL — ABNORMAL HIGH (ref 70–99)
Potassium: 4.9 mEq/L (ref 3.5–5.1)
Sodium: 141 mEq/L (ref 135–145)
Total Protein: 6.8 g/dL (ref 6.0–8.3)

## 2012-04-23 LAB — LIPID PANEL
Cholesterol: 163 mg/dL (ref 0–200)
HDL: 63.6 mg/dL (ref >39.00)
LDL Cholesterol: 90 mg/dL (ref 0–99)
Triglycerides: 47 mg/dL (ref 0.0–149.0)
VLDL: 9.4 mg/dL (ref 0.0–40.0)

## 2012-04-23 MED ORDER — SIMVASTATIN 10 MG PO TABS: 10.0000 mg | ORAL_TABLET | Freq: Every day | ORAL | Status: DC

## 2012-04-23 MED ORDER — MONTELUKAST SODIUM 10 MG PO TABS: 10.0000 mg | ORAL_TABLET | Freq: Every day | ORAL | Status: DC

## 2012-04-23 MED ORDER — LOSARTAN POTASSIUM 25 MG PO TABS: 25.0000 mg | ORAL_TABLET | Freq: Every day | ORAL | Status: DC

## 2012-04-23 MED ORDER — MOMETASONE FURO-FORMOTEROL FUM 200-5 MCG/ACT IN AERO: 2.0000 | INHALATION_SPRAY | Freq: Two times a day (BID) | RESPIRATORY_TRACT | Status: DC

## 2012-04-23 NOTE — Patient Instructions (Addendum)
Take calcium 1 gr and vit D [phone] u daily ---- Schedule labs in 4 weeks: FLP AST ALT--- hyperlipidemia BMP-- elevated BP --- Come back in 6 months

## 2012-04-23 NOTE — Progress Notes (Signed)
  Subjective:    Patient ID: April Little, female    DOB: 1950-02-17, 62 y.o.   MRN: [id number]  HPI Complete physical exam  Past Medical History  Diagnosis Date  . Diabetes type I     on a pump , f/u Dr Sharl Ma  . Asthma   . Hyperlipidemia   . Menopause     age 62   Past Surgical History  Procedure Laterality Date  . Cervical cone biopsy      in her 39s  . Knee arthroscopy  11-16-2011    L   History   Social History  . Marital Status: Married    Spouse Name: N/A    Number of Children: 2  . 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 Smoking status: Former Smoker -- 3.00 packs/day for 12 years    Types: Cigarettes    Quit date: 02/15/1979  . Smokeless tobacco: Never Used     Comment: 3ppd x 10 years  . Alcohol Use: Yes     Comment: socially   . Drug Use: No  . Sexually Active: Not on file   Other Topics Concern  . Not on file   Social History Narrative   Plays tennis, loosing some weight, dieting better    Family History  Problem Relation Age of Onset  . Coronary artery disease      M, B (MI age 30), B (age 8)  . Diabetes      several w/ type II  . Cirrhosis Father   . Colon cancer Neg Hx   . Prostate cancer Neg Hx      Review of Systems In general feeling well. Was taking lisinopril 5 mg daily, ran out a few months ago, BPs sometimes in the 150s. Needs a refill on simvastatin,  off for few  months. Needs a refill of Singulair, off for several weeks. Taking the Allegra once a day  . Asthma seems to be well-controlled, nor use albuterol more than 2 or 3 times a week. Sees the eye doctor regularly. Denies chest pain or shortness or breath No nausea, vomiting, diarrhea. No dysuria or gross hematuria.     Objective:   Physical Exam  BP 134/82  Pulse 70  Temp(Src) 98.3 F (36.8 C) (Oral)  Ht 5\' 8"  (1.727 m)  Wt 174 lb 3.2 oz (79.017 kg)  BMI 26.49 kg/m2  SpO2 95%  General -- alert,  well-developed  Neck --no thyromegaly Lungs -- normal respiratory effort, no intercostal retractions, no accessory muscle use, and normal breath sounds.   Heart-- normal rate, regular rhythm, no murmur, and no gallop.   Abdomen--soft, non-tender, no distention, no masses, no HSM, no guarding, and no rigidity.   Extremities-- no pretibial edema bilaterally  Neurologic-- alert & oriented X3 and strength normal in all extremities. Psych-- Cognition and judgment appear intact. Alert and cooperative with normal attention span and concentration.  not anxious appearing and not depressed appearing.        Assessment & Plan:   Patient is somehow reluctant to see  endocrinology frequently, encourage to discuss that with Dr.Kerr; she also likes to come here infrequently, recommend to come back in 6 months

## 2012-04-23 NOTE — Assessment & Plan Note (Addendum)
Td 08 pneumonia shot--> 11-10 MMG 05-2009 (-) ----> schedule one   h/o conization, several normal PAPs afterwards ----> neg PAP 04-2009 (-), visualization of cervix was limited h/o atrophic vaginitis   Plan-------- gyn referral  iFOB (-) 05-2009 , dicussed cscope vs IFOB--- refer to GI  DEXA 05-2009 Tscore -1.5, rec Ca and vit d, recheck next year

## 2012-04-23 NOTE — Assessment & Plan Note (Signed)
Was on a low dose of lisinopril, BP elevated lately, start ARBs, low dose, see instructions

## 2012-04-23 NOTE — Assessment & Plan Note (Signed)
RF singulair and dulera

## 2012-04-23 NOTE — Assessment & Plan Note (Signed)
Off simva x months, labs, Rx simva

## 2012-04-24 ENCOUNTER — Encounter: Payer: Self-pay | Admitting: Gastroenterology

## 2012-04-24 LAB — MICROALBUMIN / CREATININE URINE RATIO: Creatinine,U: 26.1 mg/dL

## 2012-05-01 LAB — HM PAP SMEAR

## 2012-05-02 ENCOUNTER — Telehealth: Payer: Self-pay | Admitting: *Deleted

## 2012-05-02 ENCOUNTER — Ambulatory Visit (AMBULATORY_SURGERY_CENTER): Payer: BC Managed Care – PPO | Admitting: *Deleted

## 2012-05-02 ENCOUNTER — Encounter: Payer: Self-pay | Admitting: Gastroenterology

## 2012-05-02 VITALS — Ht 68.0 in | Wt 174.4 lb

## 2012-05-02 DIAGNOSIS — Z1211 Encounter for screening for malignant neoplasm of colon: Secondary | ICD-10-CM

## 2012-05-02 MED ORDER — MOVIPREP 100 G PO SOLR: ORAL | Status: DC

## 2012-05-02 NOTE — Telephone Encounter (Signed)
Patty:  Pt is scheduled for screening colonoscopy Monday 3/31 at 10:00 with Dr. Christella Hartigan.  Pt is diabetic using insulin pump. Please send insulin pump letter to Dr. Talmage Coin regarding insulin pump dosage day before and day of procedure.  Office number is:  [phone] and Valinda Hoar is: [phone].  Pt knows to call and ask to speak to you if she has not heard from you by Wednesday 3/26.  Best number to reach pt: [phone].  Thanks, Olegario Messier

## 2012-05-02 NOTE — Telephone Encounter (Signed)
Pt letter has been sent to Dr Sharl Ma for response

## 2012-05-03 NOTE — Telephone Encounter (Signed)
Letter received waiting on response

## 2012-05-03 NOTE — Telephone Encounter (Signed)
Dr Sharl Ma was called to verify that the insulin letter was received Left message on machine to call back

## 2012-05-07 NOTE — Telephone Encounter (Signed)
I spoke with Dr Daune Perch CMA [facility] and she states that the letter is on Dr Daune Perch desk and will be reviewed she will fax a response as soon as available, call also placed to the pt to make her aware of the progress

## 2012-05-07 NOTE — Telephone Encounter (Signed)
Left message at Dr Daune Perch office on machine to call back

## 2012-05-08 NOTE — Telephone Encounter (Signed)
Insulin pump letter received and instructions have been given to the pt.  Letter to be scanned into EPIC

## 2012-05-14 ENCOUNTER — Encounter: Payer: Self-pay | Admitting: Gastroenterology

## 2012-05-14 ENCOUNTER — Ambulatory Visit (AMBULATORY_SURGERY_CENTER): Payer: BC Managed Care – PPO | Admitting: Gastroenterology

## 2012-05-14 ENCOUNTER — Other Ambulatory Visit: Payer: Self-pay | Admitting: Gastroenterology

## 2012-05-14 VITALS — BP 135/55 | HR 69 | Temp 96.7°F | Resp 17 | Ht 68.0 in | Wt 174.0 lb

## 2012-05-14 DIAGNOSIS — Z1211 Encounter for screening for malignant neoplasm of colon: Secondary | ICD-10-CM

## 2012-05-14 MED ORDER — SODIUM CHLORIDE 0.9 % IV SOLN: 500.0000 mL | INTRAVENOUS | Status: DC

## 2012-05-14 NOTE — Progress Notes (Signed)
The pt reset her insulin pump when she sat on on the side of the stretcher to dress.  Her blood sugar was 161 in the recovery room and she and her husband were aware of this.  No complaints noted in the recovery room. Maw   Patient did not experience any of the following events: a burn prior to discharge; a fall within the facility; wrong site/side/patient/procedure/implant event; or a hospital transfer or hospital admission upon discharge from the facility. (G8907)Patient did not have preoperative order for IV antibiotic SSI prophylaxis. [phone])

## 2012-05-14 NOTE — Progress Notes (Signed)
Discussed with patient and Dr. Christella Hartigan patient's blood sugar is 153, insulin pump on. Decided during Time Out to suspend the pump for the procedure. Patient suspending pump.

## 2012-05-14 NOTE — Patient Instructions (Addendum)
Your blood sugar was 161 in the recovery room.  You will reset you insulin pump before leaving the recovery room.  You may resume your other current medications today.  Please call if any questions or concerns.    YOU HAD AN ENDOSCOPIC PROCEDURE TODAY AT THE [facility]: Refer to the procedure report that was given to you for any specific questions about what was found during the examination.  If the procedure report does not answer your questions, please call your gastroenterologist to clarify.  If you requested that your care partner not be given the details of your procedure findings, then the procedure report has been included in a sealed envelope for you to review at your convenience later.  YOU SHOULD EXPECT: Some feelings of bloating in the abdomen. Passage of more gas than usual.  Walking can help get rid of the air that was put into your GI tract during the procedure and reduce the bloating. If you had a lower endoscopy (such as a colonoscopy or flexible sigmoidoscopy) you may notice spotting of blood in your stool or on the toilet paper. If you underwent a bowel prep for your procedure, then you may not have a normal bowel movement for a few days.  DIET: Your first meal following the procedure should be a light meal and then it is ok to progress to your normal diet.  A half-sandwich or bowl of soup is an example of a good first meal.  Heavy or fried foods are harder to digest and may make you feel nauseous or bloated.  Likewise meals heavy in dairy and vegetables can cause extra gas to form and this can also increase the bloating.  Drink plenty of fluids but you should avoid alcoholic beverages for 24 hours.  ACTIVITY: Your care partner should take you home directly after the procedure.  You should plan to take it easy, moving slowly for the rest of the day.  You can resume normal activity the day after the procedure however you should NOT DRIVE or use heavy machinery for 24 hours  (because of the sedation medicines used during the test).    SYMPTOMS TO REPORT IMMEDIATELY: A gastroenterologist can be reached at any hour.  During normal business hours, 8:30 AM to 5:00 PM Monday through Friday, call [phone].  After hours and on weekends, please call the GI answering service at [phone] who will take a message and have the physician on call contact you.   Following lower endoscopy (colonoscopy or flexible sigmoidoscopy):  Excessive amounts of blood in the stool  Significant tenderness or worsening of abdominal pains  Swelling of the abdomen that is new, acute  Fever of 100F or higher   FOLLOW UP: If any biopsies were taken you will be contacted by phone or by letter within the next 1-3 weeks.  Call your gastroenterologist if you have not heard about the biopsies in 3 weeks.  Our staff will call the home number listed on your records the next business day following your procedure to check on you and address any questions or concerns that you may have at that time regarding the information given to you following your procedure. This is a courtesy call and so if there is no answer at the home number and we have not heard from you through the emergency physician on call, we will assume that you have returned to your regular daily activities without incident.  SIGNATURES/CONFIDENTIALITY: You and/or your care partner have  signed paperwork which will be entered into your electronic medical record.  These signatures attest to the fact that that the information above on your After Visit Summary has been reviewed and is understood.  Full responsibility of the confidentiality of this discharge information lies with you and/or your care-partner.

## 2012-05-14 NOTE — Op Note (Signed)
[facility] [address] [place], [address]   COLONOSCOPY PROCEDURE REPORT  PATIENT: [name], April  MR#: [id number] BIRTHDATE: 1950/12/17 , 62  yrs. old GENDER: Female ENDOSCOPIST: Rachael Fee, MD REFERRED YQ:IHKV Drue Novel, M.D. PROCEDURE DATE:  05/14/2012 PROCEDURE:   Colonoscopy, screening ASA CLASS:   Class II INDICATIONS:  average risk screening. MEDICATIONS: Fentanyl 75 mcg IV, Versed 6 mg IV, and These medications were titrated to patient response per physician's verbal order  DESCRIPTION OF PROCEDURE:   After the risks benefits and alternatives of the procedure were thoroughly explained, informed consent was obtained.  A digital rectal exam revealed no abnormalities of the rectum.   The LB [id number]-[id number]  endoscope was introduced through the anus and advanced to the cecum, which was identified by both the appendix and ileocecal valve. No adverse events experienced.   The quality of the prep was good.  The instrument was then slowly withdrawn as the colon was fully examined.  COLON FINDINGS: A normal appearing cecum, ileocecal valve, and appendiceal orifice were identified.  The ascending, hepatic flexure, transverse, splenic flexure, descending, sigmoid colon and rectum appeared unremarkable.  No polyps or cancers were seen. Retroflexed views revealed no abnormalities. The time to cecum=6 minutes 56 seconds.  Withdrawal time=9 minutes 20 seconds.  The scope was withdrawn and the procedure completed. COMPLICATIONS: There were no complications.  ENDOSCOPIC IMPRESSION: Normal colon No polyps or cancers  RECOMMENDATIONS: You should continue to follow colorectal cancer screening guidelines for "routine risk" patients with a repeat colonoscopy in 10 years. There is no need for FOBT (stool) testing for at least 5 years.   eSigned:  Rachael Fee, MD 05/14/2012 10:23 AM

## 2012-05-15 ENCOUNTER — Telehealth: Payer: Self-pay | Admitting: *Deleted

## 2012-05-15 NOTE — Telephone Encounter (Signed)
  Follow up Call-  Call back number 05/14/2012  Post procedure Call Back phone  # [phone]  Permission to leave phone message Yes     Patient questions:  Do you have a fever, pain , or abdominal swelling? no Pain Score  0 *  Have you tolerated food without any problems? yes  Have you been able to return to your normal activities? yes  Do you have any questions about your discharge instructions: Diet   no Medications  no Follow up visit  no  Do you have questions or concerns about your Care? no  Actions: * If pain score is 4 or above: No action needed, pain <4.

## 2012-05-16 ENCOUNTER — Ambulatory Visit
Admission: RE | Admit: 2012-05-16 | Discharge: 2012-05-16 | Disposition: A | Discharge status: Home / Self Care | Payer: BC Managed Care – PPO | Source: Ambulatory Visit | Attending: Internal Medicine | Admitting: Internal Medicine

## 2012-05-16 DIAGNOSIS — Z1231 Encounter for screening mammogram for malignant neoplasm of breast: Secondary | ICD-10-CM

## 2012-05-21 ENCOUNTER — Other Ambulatory Visit (INDEPENDENT_AMBULATORY_CARE_PROVIDER_SITE_OTHER): Payer: BC Managed Care – PPO

## 2012-05-21 DIAGNOSIS — E785 Hyperlipidemia, unspecified: Secondary | ICD-10-CM

## 2012-05-21 DIAGNOSIS — IMO0001 Reserved for inherently not codable concepts without codable children: Secondary | ICD-10-CM

## 2012-05-21 DIAGNOSIS — R030 Elevated blood-pressure reading, without diagnosis of hypertension: Secondary | ICD-10-CM

## 2012-05-22 LAB — LIPID PANEL
HDL: 65.1 mg/dL (ref >39.00)
Total CHOL/HDL Ratio: 2
Triglycerides: 37 mg/dL (ref 0.0–149.0)
VLDL: 7.4 mg/dL (ref 0.0–40.0)

## 2012-05-22 LAB — BASIC METABOLIC PANEL
BUN: 15 mg/dL (ref 6–23)
Chloride: 103 mEq/L (ref 96–112)
Glucose, Bld: 89 mg/dL (ref 70–99)
Potassium: 4.1 mEq/L (ref 3.5–5.1)

## 2012-05-24 ENCOUNTER — Telehealth: Payer: Self-pay | Admitting: *Deleted

## 2012-05-24 MED ORDER — SIMVASTATIN 10 MG PO TABS: 10.0000 mg | ORAL_TABLET | Freq: Every day | ORAL | Status: DC

## 2012-05-24 MED ORDER — LOSARTAN POTASSIUM 25 MG PO TABS: 25.0000 mg | ORAL_TABLET | Freq: Every day | ORAL | Status: DC

## 2012-05-24 NOTE — Telephone Encounter (Signed)
Refill done.  

## 2012-08-24 ENCOUNTER — Telehealth: Payer: Self-pay | Admitting: *Deleted

## 2012-08-24 MED ORDER — SIMVASTATIN 10 MG PO TABS: 10.0000 mg | ORAL_TABLET | Freq: Every day | ORAL | Status: DC

## 2012-08-24 MED ORDER — LOSARTAN POTASSIUM 25 MG PO TABS: 25.0000 mg | ORAL_TABLET | Freq: Every day | ORAL | Status: DC

## 2012-08-24 NOTE — Telephone Encounter (Signed)
Pt made aware By Elberta Fortis

## 2012-08-24 NOTE — Telephone Encounter (Signed)
Rx sent. Left message to call office to advise Pt that Singulair was sent into the pharmacy on 04-23-12 #[phone]

## 2012-10-22 ENCOUNTER — Ambulatory Visit (INDEPENDENT_AMBULATORY_CARE_PROVIDER_SITE_OTHER): Payer: BC Managed Care – PPO | Admitting: Internal Medicine

## 2012-10-22 ENCOUNTER — Encounter: Payer: Self-pay | Admitting: Internal Medicine

## 2012-10-22 VITALS — BP 105/70 | HR 79 | Temp 98.5°F | Wt 154.8 lb

## 2012-10-22 DIAGNOSIS — E119 Type 2 diabetes mellitus without complications: Secondary | ICD-10-CM

## 2012-10-22 DIAGNOSIS — N61 Mastitis without abscess: Secondary | ICD-10-CM

## 2012-10-22 DIAGNOSIS — E109 Type 1 diabetes mellitus without complications: Secondary | ICD-10-CM

## 2012-10-22 LAB — COMPREHENSIVE METABOLIC PANEL
Albumin: 3.9 g/dL (ref 3.5–5.2)
BUN: 11 mg/dL (ref 6–23)
CO2: 25 mEq/L (ref 19–32)
Calcium: 9 mg/dL (ref 8.4–10.5)
GFR: 70.94 mL/min (ref >60.00)
Glucose, Bld: 105 mg/dL — ABNORMAL HIGH (ref 70–99)
Potassium: 3.8 mEq/L (ref 3.5–5.1)
Sodium: 138 mEq/L (ref 135–145)
Total Protein: 7.1 g/dL (ref 6.0–8.3)

## 2012-10-22 LAB — MICROALBUMIN / CREATININE URINE RATIO
Creatinine,U: 162.6 mg/dL
Microalb Creat Ratio: 0.8 mg/g (ref 0.0–30.0)
Microalb, Ur: 1.3 mg/dL (ref 0.0–1.9)

## 2012-10-22 LAB — LIPID PANEL: Total CHOL/HDL Ratio: 2

## 2012-10-22 LAB — TSH: TSH: 1.71 u[IU]/mL (ref 0.35–5.50)

## 2012-10-22 LAB — HEMOGLOBIN A1C: Hgb A1c MFr Bld: 9.2 % — ABNORMAL HIGH (ref 4.6–6.5)

## 2012-10-22 LAB — T4, FREE: Free T4: 0.87 ng/dL (ref 0.60–1.60)

## 2012-10-22 NOTE — Assessment & Plan Note (Signed)
Followup by Dr. Sharl Ma, he requested labs which will be ordered today and fax  to him

## 2012-10-22 NOTE — Patient Instructions (Signed)
Get your blood work before you leave  Next visit in 6 months  for a physical exam   Please make an appointment before you leave the office today (or call few weeks in advance) ---- Take Bactrim for 10 days. Come back anytime if you have fever, chills or redness start spreading. Come back if the area is not completely back to normal on self exam

## 2012-10-22 NOTE — Progress Notes (Signed)
  Subjective:    Patient ID: April Little, female    DOB: 1950/03/03, 62 y.o.   MRN: [id number]  HPI Today we discussed the following issues: 3 days ago developed pain, burning at the R breast, f/u by subjective fever. 2 days ago went to the walk-in clinic, diagnosed with right breast cellulitis and prescribed Bactrim. Since then, they area is not as red but the extent of the infection is about the same per pt . Needs labs ordered by endocrinology.  Past Medical History  Diagnosis Date  . Diabetes type I     on a pump , f/u Dr Sharl Ma  . Asthma   . Hyperlipidemia   . Menopause     age 70   Past Surgical History  Procedure Laterality Date  . Cervical cone biopsy      in her 76s  . Knee arthroscopy  11-16-2011    L      Review of Systems No subjective fever in the last 24 hours. Reports having breast cellulitis many years ago. Last mammogram normal 04/2012    Objective:   Physical Exam  Constitutional: She appears well-developed and well-nourished. No distress.  Neck: Normal range of motion.  No neck mass  Lymphadenopathy:    She has no cervical adenopathy.  Skin: She is not diaphoretic.      Nipple bilaterally normal to inspection and palpation     Assessment & Plan:

## 2012-10-24 ENCOUNTER — Telehealth: Payer: Self-pay | Admitting: *Deleted

## 2012-10-24 NOTE — Telephone Encounter (Signed)
Message copied by Eustace Quail on Wed Oct 24, 2012  8:32 AM ------      Message from: Wanda Plump      Created: Tue Oct 23, 2012  5:38 PM       Onalee Hua,      Please fax all these results to Dr. Sharl Ma, endocrinology. ------

## 2012-10-24 NOTE — Telephone Encounter (Signed)
Labs faxed over to Dr. Sharl Ma endocrinology as requested by Dr. Drue Novel.DJR

## 2012-11-03 ENCOUNTER — Other Ambulatory Visit: Payer: Self-pay | Admitting: Internal Medicine

## 2012-11-05 NOTE — Telephone Encounter (Signed)
rx refilled per protocol. DJR  

## 2012-11-22 ENCOUNTER — Encounter: Payer: Self-pay | Admitting: Internal Medicine

## 2012-12-18 ENCOUNTER — Emergency Department (HOSPITAL_BASED_OUTPATIENT_CLINIC_OR_DEPARTMENT_OTHER): Payer: BC Managed Care – PPO

## 2012-12-18 ENCOUNTER — Encounter (HOSPITAL_BASED_OUTPATIENT_CLINIC_OR_DEPARTMENT_OTHER): Payer: Self-pay | Admitting: Emergency Medicine

## 2012-12-18 ENCOUNTER — Emergency Department (HOSPITAL_BASED_OUTPATIENT_CLINIC_OR_DEPARTMENT_OTHER)
Admission: EM | Admit: 2012-12-18 | Discharge: 2012-12-18 | Disposition: A | Discharge status: Home / Self Care | Payer: BC Managed Care – PPO | Attending: Emergency Medicine | Admitting: Emergency Medicine

## 2012-12-18 DIAGNOSIS — E785 Hyperlipidemia, unspecified: Secondary | ICD-10-CM | POA: Diagnosis not present

## 2012-12-18 DIAGNOSIS — Z7982 Long term (current) use of aspirin: Secondary | ICD-10-CM | POA: Insufficient documentation

## 2012-12-18 DIAGNOSIS — S39012A Strain of muscle, fascia and tendon of lower back, initial encounter: Secondary | ICD-10-CM

## 2012-12-18 DIAGNOSIS — Z8742 Personal history of other diseases of the female genital tract: Secondary | ICD-10-CM | POA: Insufficient documentation

## 2012-12-18 DIAGNOSIS — S8990XA Unspecified injury of unspecified lower leg, initial encounter: Secondary | ICD-10-CM | POA: Insufficient documentation

## 2012-12-18 DIAGNOSIS — Z79899 Other long term (current) drug therapy: Secondary | ICD-10-CM | POA: Diagnosis not present

## 2012-12-18 DIAGNOSIS — S335XXA Sprain of ligaments of lumbar spine, initial encounter: Secondary | ICD-10-CM | POA: Diagnosis not present

## 2012-12-18 DIAGNOSIS — J45909 Unspecified asthma, uncomplicated: Secondary | ICD-10-CM | POA: Diagnosis not present

## 2012-12-18 DIAGNOSIS — Z87891 Personal history of nicotine dependence: Secondary | ICD-10-CM | POA: Diagnosis not present

## 2012-12-18 DIAGNOSIS — M25562 Pain in left knee: Secondary | ICD-10-CM

## 2012-12-18 DIAGNOSIS — Y92410 Unspecified street and highway as the place of occurrence of the external cause: Secondary | ICD-10-CM | POA: Insufficient documentation

## 2012-12-18 DIAGNOSIS — Y9389 Activity, other specified: Secondary | ICD-10-CM | POA: Insufficient documentation

## 2012-12-18 DIAGNOSIS — Z794 Long term (current) use of insulin: Secondary | ICD-10-CM | POA: Insufficient documentation

## 2012-12-18 DIAGNOSIS — E109 Type 1 diabetes mellitus without complications: Secondary | ICD-10-CM | POA: Diagnosis not present

## 2012-12-18 MED ORDER — CYCLOBENZAPRINE HCL 5 MG PO TABS: 5.0000 mg | ORAL_TABLET | Freq: Two times a day (BID) | ORAL | Status: DC | PRN

## 2012-12-18 MED ORDER — HYDROCODONE-ACETAMINOPHEN 5-325 MG PO TABS: 2.0000 | ORAL_TABLET | ORAL | Status: DC | PRN
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
## 2012-12-18 NOTE — ED Provider Notes (Signed)
Medical screening examination/treatment/procedure(s) were performed by non-physician practitioner and as supervising physician I was immediately available for consultation/collaboration.  EKG Interpretation   None        Geoffery Lyons, MD 12/18/12 2038
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
## 2012-12-20 ENCOUNTER — Other Ambulatory Visit: Payer: Self-pay

## 2013-01-01 ENCOUNTER — Encounter: Payer: Self-pay | Admitting: Internal Medicine

## 2013-04-24 ENCOUNTER — Telehealth: Payer: Self-pay

## 2013-04-24 NOTE — Telephone Encounter (Addendum)
Medication and allergies:  Reviewed and updated  90 day supply/mail order: na Local pharmacy: [facility] and [address]   Immunizations due:  Shingles, did not get flu vaccine this season  A/P:   No changes to [place], PSH or Personal Hx Tdap--2008 PNA--10/2007, 12/2008 Shingles--due Pap--Dr Mody--04/2012--neg MMG--05/2012--neg bi rads Bone Density--05/2009--low bone mass [facility]--04/2012--Dr Jacobs--next due 2024 Eye Exam--10/2012--no retinopathy  To Discuss with Provider: Not at this time

## 2013-04-25 ENCOUNTER — Ambulatory Visit (INDEPENDENT_AMBULATORY_CARE_PROVIDER_SITE_OTHER): Payer: BC Managed Care – PPO | Admitting: Internal Medicine

## 2013-04-25 ENCOUNTER — Encounter: Payer: Self-pay | Admitting: Internal Medicine

## 2013-04-25 VITALS — BP 146/75 | HR 86 | Temp 97.9°F | Ht 68.0 in | Wt 159.0 lb

## 2013-04-25 DIAGNOSIS — Z0000 Encounter for general adult medical examination without abnormal findings: Secondary | ICD-10-CM

## 2013-04-25 DIAGNOSIS — Z780 Asymptomatic menopausal state: Secondary | ICD-10-CM

## 2013-04-25 DIAGNOSIS — Z2911 Encounter for prophylactic immunotherapy for respiratory syncytial virus (RSV): Secondary | ICD-10-CM

## 2013-04-25 DIAGNOSIS — J45909 Unspecified asthma, uncomplicated: Secondary | ICD-10-CM

## 2013-04-25 DIAGNOSIS — IMO0001 Reserved for inherently not codable concepts without codable children: Secondary | ICD-10-CM

## 2013-04-25 DIAGNOSIS — N61 Mastitis without abscess: Secondary | ICD-10-CM

## 2013-04-25 DIAGNOSIS — R030 Elevated blood-pressure reading, without diagnosis of hypertension: Secondary | ICD-10-CM

## 2013-04-25 DIAGNOSIS — Z23 Encounter for immunization: Secondary | ICD-10-CM

## 2013-04-25 LAB — CBC WITH DIFFERENTIAL/PLATELET
BASOS ABS: 0.1 10*3/uL (ref 0.0–0.1)
BASOS PCT: 0.8 % (ref 0.0–3.0)
EOS ABS: 0.6 10*3/uL (ref 0.0–0.7)
Eosinophils Relative: 9.8 % — ABNORMAL HIGH (ref 0.0–5.0)
HCT: 37.7 % (ref 36.0–46.0)
Hemoglobin: 12.4 g/dL (ref 12.0–15.0)
LYMPHS PCT: 20.9 % (ref 12.0–46.0)
Lymphs Abs: 1.3 10*3/uL (ref 0.7–4.0)
MCHC: 32.8 g/dL (ref 30.0–36.0)
MCV: 84.6 fl (ref 78.0–100.0)
Monocytes Absolute: 0.5 10*3/uL (ref 0.1–1.0)
Monocytes Relative: 7.2 % (ref 3.0–12.0)
Neutro Abs: 3.9 10*3/uL (ref 1.4–7.7)
Neutrophils Relative %: 61.3 % (ref 43.0–77.0)
Platelets: 247 10*3/uL (ref 150.0–400.0)
RBC: 4.46 Mil/uL (ref 3.87–5.11)
RDW: 14.4 % (ref 11.5–14.6)
WBC: 6.4 10*3/uL (ref 4.5–10.5)

## 2013-04-25 LAB — BASIC METABOLIC PANEL
BUN: 14 mg/dL (ref 6–23)
CALCIUM: 8.7 mg/dL (ref 8.4–10.5)
CO2: 27 meq/L (ref 19–32)
Chloride: 110 mEq/L (ref 96–112)
Creatinine, Ser: 0.7 mg/dL (ref 0.4–1.2)
GFR: 91.31 mL/min (ref >60.00)
Glucose, Bld: 53 mg/dL — ABNORMAL LOW (ref 70–99)
Potassium: 3.7 mEq/L (ref 3.5–5.1)
SODIUM: 142 meq/L (ref 135–145)

## 2013-04-25 LAB — LIPID PANEL
CHOLESTEROL: 162 mg/dL (ref 0–200)
HDL: 97.8 mg/dL (ref >39.00)
LDL CALC: 56 mg/dL (ref 0–99)
Total CHOL/HDL Ratio: 2
Triglycerides: 42 mg/dL (ref 0.0–149.0)
VLDL: 8.4 mg/dL (ref 0.0–40.0)

## 2013-04-25 LAB — ALT: ALT: 20 U/L (ref 0–35)

## 2013-04-25 LAB — AST: AST: 20 U/L (ref 0–37)

## 2013-04-25 MED ORDER — CYCLOBENZAPRINE HCL 10 MG PO TABS: 10.0000 mg | ORAL_TABLET | Freq: Every evening | ORAL | Status: DC | PRN

## 2013-04-25 NOTE — Assessment & Plan Note (Signed)
Well controlled with current regimen, uses albuterol twice a week on average

## 2013-04-25 NOTE — Assessment & Plan Note (Addendum)
Td 08 pneumonia shot 23 --> 11-10 PNM 13---today zostavax -- today  h/o conization, several normal PAPs afterwards Pap--Dr Mody--04/2012--neg  MMG--05/2012--neg bi rads rec to see gyn CCS--04/2012--Dr Jacobs--next due 2024  Eye Exam--10/2012--no retinopathy  Diet and exercise discussed DEXA 05-2009 Tscore -1.5, rec Ca and vit d, recheck DEXA
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
## 2013-04-25 NOTE — Assessment & Plan Note (Signed)
Resolved per pt.

## 2013-04-25 NOTE — Patient Instructions (Signed)
Get your blood work before you leave   Next visit is for a physical exam in 1 year , fasting Please make an appointment    Check the  blood pressure 2 or 3 times a month   be sure it is between 110/60 and 140/85. Ideal blood pressure is 120/80. If it is consistently higher or lower, let me know  See the orthopedic doctor again regards back pain. Try  Flexeril at bedtime, will cause drowsiness

## 2013-04-25 NOTE — Assessment & Plan Note (Signed)
Recommend continue monitoring BPs, see instructions

## 2013-04-25 NOTE — Progress Notes (Signed)
Pre visit review using our clinic review tool, if applicable. No additional management support is needed unless otherwise documented below in the visit note. 

## 2013-04-26 LAB — VITAMIN D 25 HYDROXY (VIT D DEFICIENCY, FRACTURES): Vit D, 25-Hydroxy: 41 ng/mL (ref 30–89)

## 2013-05-23 ENCOUNTER — Other Ambulatory Visit: Payer: Self-pay

## 2013-05-29 ENCOUNTER — Other Ambulatory Visit: Payer: Self-pay | Admitting: Internal Medicine

## 2013-05-29 LAB — HM DIABETES EYE EXAM

## 2013-05-29 NOTE — Telephone Encounter (Signed)
Rx's sent to the pharmacy by e-script.//AB/CMA 

## 2013-05-30 ENCOUNTER — Encounter: Payer: Self-pay | Admitting: Internal Medicine

## 2013-06-14 LAB — HEMOGLOBIN A1C: Hgb A1c MFr Bld: 8.2 % — AB (ref 4.0–6.0)

## 2013-07-01 LAB — HM DIABETES EYE EXAM

## 2013-07-09 ENCOUNTER — Encounter: Payer: Self-pay | Admitting: Internal Medicine

## 2013-07-16 ENCOUNTER — Telehealth: Payer: Self-pay | Admitting: *Deleted

## 2013-07-16 NOTE — Telephone Encounter (Signed)
Patient dropped off paperwork to be completed for the DMV. Forms filled out as much as possible and forwarded to Dr. Larose Kells. JG//CMA

## 2013-07-22 NOTE — Telephone Encounter (Signed)
Received signed forms. Patient informed that they are ready for pick up. JG//CMA

## 2013-09-03 ENCOUNTER — Telehealth: Payer: Self-pay

## 2013-09-03 NOTE — Telephone Encounter (Signed)
Diabetic bundle  A1C was done at [facility] on 06-14-13. A1C was 8.2%. Updated   Did send mychart message: pt needs BP rechecked

## 2013-10-22 ENCOUNTER — Ambulatory Visit (INDEPENDENT_AMBULATORY_CARE_PROVIDER_SITE_OTHER): Payer: BC Managed Care – PPO | Admitting: Internal Medicine

## 2013-10-22 ENCOUNTER — Encounter: Payer: Self-pay | Admitting: Internal Medicine

## 2013-10-22 VITALS — BP 130/64 | HR 72 | Temp 97.6°F | Wt 159.0 lb

## 2013-10-22 DIAGNOSIS — L089 Local infection of the skin and subcutaneous tissue, unspecified: Secondary | ICD-10-CM

## 2013-10-22 MED ORDER — MUPIROCIN CALCIUM 2 % EX CREA: 1.0000 "application " | TOPICAL_CREAM | Freq: Two times a day (BID) | CUTANEOUS | Status: DC

## 2013-10-22 MED ORDER — DOXYCYCLINE HYCLATE 100 MG PO TABS: 100.0000 mg | ORAL_TABLET | Freq: Two times a day (BID) | ORAL | Status: DC

## 2013-10-22 NOTE — Progress Notes (Signed)
Pre visit review using our clinic review tool, if applicable. No additional management support is needed unless otherwise documented below in the visit note. 

## 2013-10-22 NOTE — Patient Instructions (Signed)
Take doxycycline twice a day for 5 days Apply a cream twice a day for one week Okay to use OTC hydrocortisone 1% cream as needed Call if not improving in the next few days

## 2013-10-22 NOTE — Progress Notes (Signed)
Subjective:    Patient ID: April Little, female    DOB: 24-Dec-1950, 63 y.o.   MRN: [id number]  DOS:  10/22/2013 Type of visit - description : acute Interval history: 2 weeks ago, had a scratch of the right hand, it  got red, she saw some discharge, used  OTC abx ointments and is better now but not completely gone. Patient is concerned about MRSA. Patient is concerned because she also had a skin infection few months ago and wondered why she has so many infex.   ROS No fever chills  Past Medical History  Diagnosis Date  . Diabetes type I     on a pump , f/u Dr Buddy Duty  . Asthma   . Hyperlipidemia   . Menopause     age 2    Past Surgical History  Procedure Laterality Date  . Cervical cone biopsy      in her 83s  . Knee arthroscopy  11-16-2011    L    History   Social History  . Marital Status: Married    Spouse Name: N/A    Number of Children: 2  . 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 Smoking status: Former Smoker -- 3.00 packs/day for 12 years    Types: Cigarettes    Quit date: 02/15/1979  . Smokeless tobacco: Never Used     Comment: 3ppd x 10 years  . Alcohol Use: Yes     Comment: occasional  . Drug Use: No  . Sexual Activity: Not on file   Other Topics Concern  . Not on file   Social History Narrative   Plays tennis         Medication List       This list is accurate as of: 10/22/13  4:20 PM.  Always use your most recent med list.               ACCU-CHEK SPIRIT INSULIN PUMP Kit  by Does not apply route.     ADVIL PM PO  Take 1 tablet by mouth daily.     aspirin 81 MG tablet  Take 81 mg by mouth daily.     cetirizine 10 MG tablet  Commonly known as:  ZYRTEC  Take 10 mg by mouth daily.     cyclobenzaprine 10 MG tablet  Commonly known as:  FLEXERIL  Take 1 tablet (10 mg total) by mouth at bedtime as needed for muscle spasms.     doxycycline 100 MG tablet  Commonly known as:   VIBRA-TABS  Take 1 tablet (100 mg total) by mouth 2 (two) times daily.     glucose blood test strip  by Other route as needed. Use as instructed     HUMALOG 100 UNIT/ML injection  Generic drug:  insulin lispro  28 Units.     LITETOUCH PEN NEEDLES 29G X 12.7MM Misc  Generic drug:  Insulin Pen Needle  by Does not apply route.     losartan 25 MG tablet  Commonly known as:  COZAAR  TAKE 1 TABLET BY MOUTH DAILY     meloxicam 15 MG tablet  Commonly known as:  MOBIC     mometasone-formoterol 200-5 MCG/ACT Aero  Commonly known as:  DULERA  Inhale 2 puffs into the lungs 2 (two) times daily.     montelukast 10 MG tablet  Commonly known as:  SINGULAIR  TAKE 1  TABLET BY MOUTH EVERY NIGHT AT BEDTIME     mupirocin cream 2 %  Commonly known as:  BACTROBAN  Apply 1 application topically 2 (two) times daily.     PROVENTIL HFA 108 (90 BASE) MCG/ACT inhaler  Generic drug:  albuterol  Inhale 2 puffs into the lungs every 6 (six) hours as needed.     simvastatin 10 MG tablet  Commonly known as:  ZOCOR  TAKE 1 TABLET BY MOUTH EVERY NIGHT AT BEDTIME           Objective:   Physical Exam BP 130/64  Pulse 72  Temp(Src) 97.6 F (36.4 C) (Oral)  Wt 159 lb (72.122 kg)  SpO2 97% General -- alert, well-developed, NAD.  Skin--  Has a 2 cm scar  on the palm of the right hand, and a 3 mm superficial ulcer without redness, discharge. No fluctuance or tenderness Neurologic--  alert & oriented X3. Speech normal, gait appropriate for age, strength symmetric and appropriate for age.  Psych-- Cognition and judgment appear intact. Cooperative with normal attention span and concentration. No anxious or depressed appearing.        Assessment & Plan:   Superficial infected cut Pt is UTD on her tetanus shots I recommended Bactroban, she really likes antibiotics by mouth consequently I will prescribe doxycycline. She wonders if this is MRSA, it could  be, we agreed that the next time she has an  infection she will come back for a  culture. Multiple questions answered to the best of my ability; concerned about "frequent" infections, i don't believe she necessarily has frequent infection. We agreed on observation for now and treat as a

## 2013-12-11 ENCOUNTER — Other Ambulatory Visit: Payer: Self-pay | Admitting: Internal Medicine

## 2014-01-21 ENCOUNTER — Telehealth: Payer: Self-pay | Admitting: Critical Care Medicine

## 2014-01-21 NOTE — Telephone Encounter (Signed)
Pt last seen 08/13/10 by [facility]. She is considered a new patient and PW not accepting new pt in [place] office. She scheduled appt to see MW this month. Nothing further needed

## 2014-01-28 ENCOUNTER — Institutional Professional Consult (permissible substitution): Payer: BC Managed Care – PPO | Admitting: Internal Medicine

## 2014-04-07 ENCOUNTER — Other Ambulatory Visit: Payer: Self-pay

## 2014-04-08 ENCOUNTER — Ambulatory Visit (INDEPENDENT_AMBULATORY_CARE_PROVIDER_SITE_OTHER): Payer: 59 | Admitting: Internal Medicine

## 2014-04-08 ENCOUNTER — Encounter: Payer: Self-pay | Admitting: Internal Medicine

## 2014-04-08 ENCOUNTER — Other Ambulatory Visit: Payer: Self-pay | Admitting: Internal Medicine

## 2014-04-08 VITALS — BP 146/81 | HR 78 | Temp 97.8°F | Ht 67.0 in | Wt 163.1 lb

## 2014-04-08 DIAGNOSIS — J4540 Moderate persistent asthma, uncomplicated: Secondary | ICD-10-CM

## 2014-04-08 DIAGNOSIS — E109 Type 1 diabetes mellitus without complications: Secondary | ICD-10-CM

## 2014-04-08 DIAGNOSIS — R21 Rash and other nonspecific skin eruption: Secondary | ICD-10-CM | POA: Insufficient documentation

## 2014-04-08 MED ORDER — MOMETASONE FURO-FORMOTEROL FUM 200-5 MCG/ACT IN AERO: 2.0000 | INHALATION_SPRAY | Freq: Two times a day (BID) | RESPIRATORY_TRACT | Status: DC

## 2014-04-08 MED ORDER — AZITHROMYCIN 250 MG PO TABS: ORAL_TABLET | ORAL | Status: DC

## 2014-04-08 NOTE — Assessment & Plan Note (Signed)
Doesn't seem to be well-controlled, she reports symptoms of asthma year around but in the last few months they have been more noticeable. On exam she is definitely congested. Plan: Z-Pak (? atypical infection) Mucinex Refill Dulera Refer to pulmonology again, used to see Dr. Joya Gaskins but needs to be reestablished with another physician, missed December appointment  .

## 2014-04-08 NOTE — Assessment & Plan Note (Signed)
Sees Dr. Doree Albee (H.P.) on and off for a rash needs a referral.

## 2014-04-08 NOTE — Patient Instructions (Signed)
Take Mucinex DM twice a day for 1 week, then as needed x 2 weeks   Take the antibiotic as prescribed  (zpack) Call if not gradually better over the next  10 days

## 2014-04-08 NOTE — Progress Notes (Signed)
Pre visit review using our clinic review tool, if applicable. No additional management support is needed unless otherwise documented below in the visit note. 

## 2014-04-08 NOTE — Assessment & Plan Note (Signed)
Refer to Dr Buddy Duty

## 2014-04-08 NOTE — Progress Notes (Signed)
Subjective:    Patient ID: April Little, female    DOB: [birth year], 64 y.o.   MRN: [id number]  DOS:  04/08/2014 Type of visit - description : rov, needs referrals  Interval history:  Referral to endocrinology, pulmonology and dermatology based on her new insurance. Her asthma is not well controlled, she takes dulera bid and  often times needs a rescue inhaler. She reports cough mostly at night and immediately after she stopped playing tennis (some mucus). Her asthma has not been well-controlled for few months.   Review of Systems Denies fever chills No GERD type of symptoms No hemoptysis.   Past Medical History  Diagnosis Date  . Diabetes type I     on a pump , f/u Dr Buddy Duty  . Asthma   . Hyperlipidemia   . Menopause     age 60    Past Surgical History  Procedure Laterality Date  . Cervical cone biopsy      in her 4s  . Knee arthroscopy  11-16-2011    L    History   Social History  . Marital Status: Married    Spouse Name: N/A  . Number of Children: 2  . 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 Smoking status: Former Smoker -- 3.00 packs/day for 12 years    Types: Cigarettes    Quit date: 02/15/1979  . Smokeless tobacco: Never Used     Comment: 3ppd x 10 years  . Alcohol Use: Yes     Comment: occasional  . Drug Use: No  . Sexual Activity: Not on file   Other Topics Concern  . Not on file   Social History Narrative   Plays tennis         Medication List       This list is accurate as of: 04/08/14 11:59 PM.  Always use your most recent med list.               ACCU-CHEK SPIRIT INSULIN PUMP Kit  by Does not apply route.     ADVIL PM PO  Take 1 tablet by mouth daily.     aspirin 81 MG tablet  Take 81 mg by mouth daily.     azithromycin 250 MG tablet  Commonly known as:  ZITHROMAX Z-PAK  2 tabs a day the first day, then 1 tab a day x 4 days     cetirizine 10 MG tablet  Commonly  known as:  ZYRTEC  Take 10 mg by mouth daily.     glucose blood test strip  by Other route as needed. Use as instructed     HUMALOG 100 UNIT/ML injection  Generic drug:  insulin lispro  28 Units.     LITETOUCH PEN NEEDLES 29G X 12.7MM Misc  Generic drug:  Insulin Pen Needle  by Does not apply route.     losartan 25 MG tablet  Commonly known as:  COZAAR  TAKE 1 TABLET BY MOUTH EVERY DAY     meloxicam 15 MG tablet  Commonly known as:  MOBIC     mometasone-formoterol 200-5 MCG/ACT Aero  Commonly known as:  DULERA  Inhale 2 puffs into the lungs 2 (two) times daily.     montelukast 10 MG tablet  Commonly known as:  SINGULAIR  TAKE 1 TABLET BY MOUTH EVERY NIGHT AT BEDTIME     mupirocin cream 2 %  Commonly  known as:  BACTROBAN  Apply 1 application topically 2 (two) times daily.     PROVENTIL HFA 108 (90 BASE) MCG/ACT inhaler  Generic drug:  albuterol  Inhale 2 puffs into the lungs every 6 (six) hours as needed.     simvastatin 10 MG tablet  Commonly known as:  ZOCOR  TAKE 1 TABLET BY MOUTH EVERY NIGHT AT BEDTIME           Objective:   Physical Exam BP 146/81 mmHg  Pulse 78  Temp(Src) 97.8 F (36.6 C) (Oral)  Ht '5\' 7"'  (1.702 m)  Wt 163 lb 2 oz (73.993 kg)  BMI 25.54 kg/m2  SpO2 97% General:   Well developed, well nourished . NAD.  HEENT:  Normocephalic . Face symmetric, atraumatic, nose not congested Lungs:  Rhonchi bilaterally particularly with cough,  expiratory time increased. Normal respiratory effort, no intercostal retractions, no accessory muscle use. Heart: RRR,  no murmur.  Muscle skeletal: no pretibial edema bilaterally  Skin: Not pale. Not jaundice Neurologic:  alert & oriented X3.  Speech normal, gait appropriate for age and unassisted Psych--  Cognition and judgment appear intact.  Cooperative with normal attention span and concentration.  Behavior appropriate. No anxious or depressed appearing.       Assessment & Plan:

## 2014-04-22 ENCOUNTER — Ambulatory Visit (INDEPENDENT_AMBULATORY_CARE_PROVIDER_SITE_OTHER): Payer: 59 | Admitting: Internal Medicine

## 2014-04-22 ENCOUNTER — Encounter: Payer: Self-pay | Admitting: Internal Medicine

## 2014-04-22 ENCOUNTER — Ambulatory Visit (INDEPENDENT_AMBULATORY_CARE_PROVIDER_SITE_OTHER)
Admission: RE | Admit: 2014-04-22 | Discharge: 2014-04-22 | Disposition: A | Discharge status: Home / Self Care | Payer: 59 | Source: Ambulatory Visit | Attending: Internal Medicine | Admitting: Internal Medicine

## 2014-04-22 DIAGNOSIS — J4540 Moderate persistent asthma, uncomplicated: Secondary | ICD-10-CM

## 2014-04-22 MED ORDER — MOMETASONE FURO-FORMOTEROL FUM 100-5 MCG/ACT IN AERO: INHALATION_SPRAY | RESPIRATORY_TRACT | Status: DC

## 2014-04-22 NOTE — Patient Instructions (Addendum)
dulera 100 Take 2 puffs first thing in am and then another 2 puffs about 12 hours later.   Only use your albuterol as a rescue medication to be used if you can't catch your breath by resting or doing a relaxed purse lip breathing pattern.  - The less you use it, the better it will work when you need it. - Ok to use up to 2 puffs  every 4 hours if you must but call for immediate appointment if use goes up over your usual need - Don't leave home without it !!  (think of it like the spare tire for your car)   Please remember to go to the x-ray department downstairs for your tests - we will call you with the results when they are available.     See Tammy NP w/in 2 weeks with all your medications, even over the counter meds, separated in two separate bags, the ones you take no matter what vs the ones you stop once you feel better and take only as needed when you feel you need them.   Tammy  will generate for you a new user friendly medication calendar that will put [place] all on the same page re: your medication use.     Without this process, it simply isn't possible to assure that we are providing  your outpatient care  with  the attention to detail we feel you deserve.   If we cannot assure that you're getting that kind of care,  then we cannot manage your problem effectively from this clinic.  Once you have seen Tammy and we are sure that we're all on the same page with your medication use she will arrange follow up with me.

## 2014-04-22 NOTE — Progress Notes (Signed)
Subjective:    Patient ID: April Little, female    DOB: May 12, 1950,    MRN: [id number]  HPI   70 yowf quit smoking in 1981 with dx of asthma p quit with last eval by Dr Joya Gaskins 07/2010   The patient complains of history of diagnosed Asthma, cough, shortness of breath, chest tightness, and wheezing, but denies chest pain, mucous production, nocturnal awakening, exercise induced symptoms, and congestion. Symptoms appear triggered by exercise, allergen:, and irritant:. The patient also has the following associated problems: allergic rhinitis, nasal congestion, difficulty breathing through nose, productive cough, chest pain, and chest tightness. Previous effective treatment includes short acting beta-agonist:, ICS + LABA, leukotriene agonist, and oral steroids. Asthma monitoring and treatment delivery to date has included: no home nebulizer and no PF meter. Asthma severity risk factors include: ER visits in last year:.  THis pt has been Dx asthma for 35yrs  The asthma is is not seasonal  The pt uses albuterol once daily and ave twic e in one week  The past allergy testing rast positive Grasses and trees  The pt rx advair and albuterol and singulair and allegra is daily  The pt only uses advair as needed  rec Try magic mouthwash 55ml three times daily,  Gargle and expectorate Azithromycin 2 tabs once then 1 daily until gone Increase dulera to two puff twice daily , use spacer Return 4 months or sooner if worse or unimproving       04/22/2014 1st [facility] office visit [facility] / [place]   Chief Complaint  Patient presents with  . Pulmonary Consult    Referred by Dr. Kathlene November for eval of asthma. Pt c/o chest congestion- started in the Fall of 2015. She also c/o DOE and cough. Cough is prod with yellow sputum.  She gets SOB when she plays tennis and when walking up the stairs in her home.   not consistent with maintenance rx - admits never has been  Cough is worse at hs / zpak made it  better  On chronic singulair not clear it helps Not taking dulera 200 consistently enough to tell whether helping  Pattern is highly variable  X > 5 years, flares sub acutely and needs to know how to manage it better but always sob with exercise x Fall  2015   No obvious other patterns in day to day or daytime variabilty or assoc cp or chest tightness, subjective wheeze overt sinus or hb symptoms. No unusual exp hx or h/o childhood pna/ asthma or knowledge of premature birth.  Sleeping ok without nocturnal  or early am exacerbation  of respiratory  c/o's or need for noct saba. Also denies any obvious fluctuation of symptoms with weather or environmental changes or other aggravating or alleviating factors except as outlined above   Current Medications, Allergies, Complete Past Medical History, Past Surgical History, Family History, and Social History were reviewed in [employer] record.               Review of Systems  Constitutional: Negative for fever, chills and unexpected weight change.  HENT: Positive for congestion. Negative for dental problem, ear pain, nosebleeds, postnasal drip, rhinorrhea, sinus pressure, sneezing, sore throat, trouble swallowing and voice change.   Eyes: Negative for visual disturbance.  Respiratory: Positive for cough and shortness of breath. Negative for choking.   Cardiovascular: Negative for chest pain and leg swelling.  Gastrointestinal: Negative for vomiting, abdominal pain and diarrhea.  Genitourinary: Negative  for difficulty urinating.  Musculoskeletal: Negative for arthralgias.  Skin: Negative for rash.  Neurological: Negative for tremors, syncope and headaches.  Hematological: Does not bruise/bleed easily.       Objective:   Physical Exam   Somber wf nad freq throat clearing   Wt Readings from Last 3 Encounters:  04/22/14 160 lb 12.8 oz (72.938 kg)  04/08/14 163 lb 2 oz (73.993 kg)  10/22/13 159 lb (72.122 kg)      Vital signs reviewed    HEENT: nl dentition, turbinates, and orophanx. Nl external ear canals without cough reflex   NECK :  without JVD/Nodes/TM/ nl carotid upstrokes bilaterally   LUNGS: no acc muscle use, clear to A and P bilaterally without cough on insp or exp maneuvers   CV:  RRR  no s3 or murmur or increase in P2, no edema   ABD:  soft and nontender with nl excursion in the supine position. No bruits or organomegaly, bowel sounds nl  MS:  warm without deformities, calf tenderness, cyanosis or clubbing  SKIN: warm and dry without lesions    NEURO:  alert, approp, no deficits    CXR PA and Lateral:   04/22/2014 :     I personally reviewed images and agree with radiology impression as follows:     No evidence of acute cardiopulmonary disease.     Assessment & Plan:

## 2014-04-22 NOTE — Progress Notes (Signed)
Quick Note:  Spoke with pt and notified of results per Dr. Wert. Pt verbalized understanding and denied any questions.  ______ 

## 2014-04-23 ENCOUNTER — Encounter: Payer: Self-pay | Admitting: Internal Medicine

## 2014-04-23 NOTE — Assessment & Plan Note (Addendum)
DDX of  difficult airways management all start with A and  include Adherence, Ace Inhibitors, Acid Reflux, Active Sinus Disease, Alpha 1 Antitripsin deficiency, Anxiety masquerading as Airways dz,  ABPA,  allergy(esp in young), Aspiration (esp in elderly), Adverse effects of DPI,  Active smokers, plus two Bs  = Bronchiectasis and Beta blocker use..and one C= CHF  Adherence is always the initial "prime suspect" and is a multilayered concern that requires a "trust but verify" approach in every patient - starting with knowing how to use medications, especially inhalers, correctly, keeping up with refills and understanding the fundamental difference between maintenance and prns vs those medications only taken for a very short course and then stopped and not refilled.  - very confused with concept of maint vs prns - The proper method of use, as well as anticipated side effects, of a metered-dose inhaler are discussed and demonstrated to the patient. Improved effectiveness after extensive coaching during this visit to a level of approximately  90% so should try dulera 100 2bid regularly x 2 weeks then regroup  ? Allergy > on singulair ? If it's helping or needs more formal eval > for now ok to use zyrtec prn    Each maintenance medication was reviewed in detail including most importantly the difference between maintenance and as needed and under what circumstances the prns are to be used.  Please see instructions for details which were reviewed in writing and the patient given a copy.     To keep things simple, I have asked the patient to first separate medicines that are perceived as maintenance, that is to be taken daily "no matter what", from those medicines that are taken on only on an as-needed basis and I have given the patient examples of both, and then return to see our NP to generate a  detailed  medication calendar which should be followed until the next physician sees the patient and updates it.

## 2014-05-06 ENCOUNTER — Ambulatory Visit (INDEPENDENT_AMBULATORY_CARE_PROVIDER_SITE_OTHER): Payer: 59 | Admitting: Adult Health

## 2014-05-06 ENCOUNTER — Encounter: Payer: Self-pay | Admitting: Adult Health

## 2014-05-06 VITALS — BP 128/84 | HR 66 | Temp 97.9°F | Ht 68.0 in | Wt 165.0 lb

## 2014-05-06 DIAGNOSIS — J4540 Moderate persistent asthma, uncomplicated: Secondary | ICD-10-CM

## 2014-05-06 MED ORDER — MOMETASONE FURO-FORMOTEROL FUM 200-5 MCG/ACT IN AERO: 2.0000 | INHALATION_SPRAY | Freq: Two times a day (BID) | RESPIRATORY_TRACT | Status: DC

## 2014-05-06 NOTE — Addendum Note (Signed)
Addended by: Parke Poisson E on: 05/06/2014 05:38 PM   Modules accepted: Orders, Medications

## 2014-05-06 NOTE — Progress Notes (Signed)
Subjective:    Patient ID: April Little, female    DOB: [birth year],    MRN: [id number]  HPI   82 yowf quit smoking in 1981 with dx of asthma p quit with last eval by Dr April Little 07/2010   The patient complains of history of diagnosed Asthma, cough, shortness of breath, chest tightness, and wheezing, but denies chest pain, mucous production, nocturnal awakening, exercise induced symptoms, and congestion. Symptoms appear triggered by exercise, allergen:, and irritant:. The patient also has the following associated problems: allergic rhinitis, nasal congestion, difficulty breathing through nose, productive cough, chest pain, and chest tightness. Previous effective treatment includes short acting beta-agonist:, ICS + LABA, leukotriene agonist, and oral steroids. Asthma monitoring and treatment delivery to date has included: no home nebulizer and no PF meter. Asthma severity risk factors include: ER visits in last year:.  THis pt has been Dx asthma for 30yrs  The asthma is is not seasonal  The pt uses albuterol once daily and ave twic e in one week  The past allergy testing rast positive Grasses and trees  The pt rx advair and albuterol and singulair and allegra is daily  The pt only uses advair as needed  rec Try magic mouthwash 53ml three times daily,  Gargle and expectorate Azithromycin 2 tabs once then 1 daily until gone Increase dulera to two puff twice daily , use spacer Return 4 months or sooner if worse or unimproving       04/22/2014 1st [facility] office visit [facility] / April Little   Chief Complaint  Patient presents with  . Pulmonary Consult    Referred by Dr. Kathlene Little for eval of asthma. Pt c/o chest congestion- started in the Fall of 2015. She also c/o DOE and cough. Cough is prod with yellow sputum.  She gets SOB when she plays tennis and when walking up the stairs in her home.   not consistent with maintenance rx - admits never has been  Cough is worse at hs / zpak made it  better  On chronic singulair not clear it helps Not taking dulera 200 consistently enough to tell whether helping  Pattern is highly variable  X > 5 years, flares sub acutely and needs to know how to manage it better but always sob with exercise x Fall  2015    05/06/2014 Follow up : Asthma  Returns for 2 week follow up for Asthma  We reviewed all her meds and organized them into a med calendar with pt education  Appears to be taking correctly.  Last ov instructed to use Dulera 100 on reg basis  Feels better with less DOE and resolved cough and wheezing.  Spirometry today shows FEV1 60%, ratio 64, FVC 72%, decreased mid flows .  Pt education on asthma treatment and control  She does complain of less energy for last several months.  No recent labs. Denies snoring.    Current Medications, Allergies, Complete Past Medical History, Past Surgical History, Family History, and Social History were reviewed in [employer] record.               Review of Systems  Constitutional: Negative for fever, chills and unexpected weight change.  HENT:  . Negative for dental problem, ear pain, nosebleeds, postnasal drip, rhinorrhea, sinus pressure, sneezing, sore throat, trouble swallowing and voice change.   Eyes: Negative for visual disturbance.  Respiratory: Positive   shortness of breath. Negative for choking.   Cardiovascular: Negative for  chest pain and leg swelling.  Gastrointestinal: Negative for vomiting, abdominal pain and diarrhea.  Genitourinary: Negative for difficulty urinating.  Musculoskeletal: Negative for arthralgias.  Skin: Negative for rash.  Neurological: Negative for tremors, syncope and headaches.  Hematological: Does not bruise/bleed easily.       Objective:   Physical Exam    wf nad    Vital signs reviewed    HEENT: nl dentition, turbinates, and orophanx. Nl external ear canals without cough reflex   NECK :  without JVD/Nodes/TM/ nl  carotid upstrokes bilat  LUNGS: no acc muscle use, faint exp wheeze on forced exp   CV:  RRR  no s3 or murmur or increase in P2, no edema   ABD:  soft and nontender with nl excursion in the supine position. No bruits or organomegaly, bowel sounds nl  MS:  warm without deformities, calf tenderness, cyanosis or clubbing  SKIN: warm and dry without lesions    NEURO:  alert, approp, no deficits    CXR PA and Lateral:   04/22/2014 :     I personally reviewed images and agree with radiology impression as follows:     No evidence of acute cardiopulmonary disease.     Assessment & Plan:

## 2014-05-06 NOTE — Assessment & Plan Note (Signed)
Asthma -improved sx control on Dulera .  Spirometry shows mild to mod airflow obstruction  Change zyrtec to allegra to see if less sedating.    Plan Increase Dulera 200 2 puffs Twice daily  , rinse after use.  Change Zyrtec to Allegra daily  Follow up with Primary MD for labs.  Follow med calendar closely and bring to each visit.  Follow up .Dr. Melvyn Novas  In 6 weeks and As needed

## 2014-05-06 NOTE — Patient Instructions (Signed)
Increase Dulera 200 2 puffs Twice daily  , rinse after use.  Change Zyrtec to Allegra daily  Follow up with Primary MD for labs.  Follow med calendar closely and bring to each visit.  Follow up .Dr. Melvyn Novas  In 6 weeks and As needed

## 2014-05-07 ENCOUNTER — Telehealth: Payer: Self-pay | Admitting: Internal Medicine

## 2014-05-07 DIAGNOSIS — Z0100 Encounter for examination of eyes and vision without abnormal findings: Secondary | ICD-10-CM

## 2014-05-07 NOTE — Telephone Encounter (Signed)
Referral placed.

## 2014-05-07 NOTE — Telephone Encounter (Signed)
Caller name: Paisyn, Guercio Relation to [id number]  Call back number: [phone]   Reason for call:  Pt has an appointment today 05/07/14 with Johna Sheriff, MD [address] [place], [place] [address] Main: [phone]

## 2014-05-15 LAB — HM DIABETES FOOT EXAM: HM DIABETIC FOOT EXAM: NORMAL

## 2014-05-16 LAB — BASIC METABOLIC PANEL
BUN: 17 mg/dL (ref 4–21)
Creatinine: 0.8 mg/dL (ref <=1.1)
Glucose: 353 mg/dL
Potassium: 3.7 mmol/L (ref 3.4–5.3)
SODIUM: 138 mmol/L (ref 137–147)

## 2014-05-16 LAB — LIPID PANEL
Cholesterol: 168 mg/dL (ref 0–200)
HDL: 95 mg/dL — AB (ref 35–70)
LDL CALC: 59 mg/dL
LDl/HDL Ratio: 1.8
Triglycerides: 68 mg/dL (ref 40–160)

## 2014-05-16 LAB — CBC AND DIFFERENTIAL
HCT: 39 % (ref 36–46)
Hemoglobin: 12.2 g/dL (ref 12.0–16.0)
Neutrophils Absolute: 4 /uL
Platelets: 229 10*3/uL (ref 150–399)
WBC: 5.8 10*3/mL

## 2014-05-16 LAB — HEPATIC FUNCTION PANEL
ALK PHOS: 72 U/L (ref 25–125)
ALT: 18 U/L (ref 7–35)
AST: 17 U/L (ref 13–35)
BILIRUBIN, TOTAL: 0.3 mg/dL

## 2014-05-16 LAB — TSH: TSH: 2.94 u[IU]/mL (ref <=5.90)

## 2014-05-16 LAB — POCT ERYTHROCYTE SEDIMENTATION RATE, NON-AUTOMATED: Sed Rate: 7 mm

## 2014-05-16 LAB — HEMOGLOBIN A1C: Hgb A1c MFr Bld: 8.1 % — AB (ref 4.0–6.0)

## 2014-05-19 DIAGNOSIS — R5383 Other fatigue: Secondary | ICD-10-CM | POA: Insufficient documentation

## 2014-05-20 NOTE — Addendum Note (Signed)
Addended by: Parke Poisson E on: 05/20/2014 02:00 PM   Modules accepted: Orders, Medications

## 2014-05-30 ENCOUNTER — Ambulatory Visit (INDEPENDENT_AMBULATORY_CARE_PROVIDER_SITE_OTHER): Payer: 59 | Admitting: Internal Medicine

## 2014-05-30 ENCOUNTER — Encounter: Payer: Self-pay | Admitting: Internal Medicine

## 2014-05-30 VITALS — BP 128/64 | HR 82 | Temp 98.1°F | Ht 68.0 in | Wt 161.4 lb

## 2014-05-30 DIAGNOSIS — I10 Essential (primary) hypertension: Secondary | ICD-10-CM

## 2014-05-30 DIAGNOSIS — R030 Elevated blood-pressure reading, without diagnosis of hypertension: Secondary | ICD-10-CM

## 2014-05-30 DIAGNOSIS — IMO0001 Reserved for inherently not codable concepts without codable children: Secondary | ICD-10-CM

## 2014-05-30 NOTE — Progress Notes (Signed)
Subjective:    Patient ID: April Little, female    DOB: 1950/08/14, 64 y.o.   MRN: [id number]  DOS:  05/30/2014 Type of visit - description : acute Interval history: This week check her BP several times, he has been as high as 165/104. The one thing she is doing differing is taking Dulera consistently. Yesterday, she stopped Dulera an increase losartan to 1 tablet twice a day. BP in the morning 160, BP this afternoon pretty good.   Review of Systems Denies chest pain, difficulty breathing or lower extremity edema She follows a low-salt diet No headache or dizziness  Past Medical History  Diagnosis Date  . Diabetes type I     on a pump , f/u Dr Buddy Duty  . Asthma   . Hyperlipidemia   . Menopause     age 33    Past Surgical History  Procedure Laterality Date  . Cervical cone biopsy      in her 41s  . Knee arthroscopy  11-16-2011    L    History   Social History  . Marital Status: Married    Spouse Name: N/A  . Number of Children: 2  . 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 Smoking status: Former Smoker -- 3.00 packs/day for 12 years    Types: Cigarettes    Quit date: 02/15/1979  . Smokeless tobacco: Never Used  . Alcohol Use: 0.0 oz/week    0 Standard drinks or equivalent per week     Comment: occasional  . Drug Use: No  . Sexual Activity: Not on file   Other Topics Concern  . Not on file   Social History Narrative   Plays tennis         Medication List       This list is accurate as of: 05/30/14 11:59 PM.  Always use your most recent med list.               aspirin 81 MG tablet  Take 81 mg by mouth daily.     bisacodyl 5 MG EC tablet  Commonly known as:  DULCOLAX  Take 5 mg by mouth daily as needed for moderate constipation.     dextromethorphan 30 MG/5ML liquid  Commonly known as:  DELSYM  2 tsp every 12 hours as needed     diphenhydramine-acetaminophen 25-500 MG Tabs    Commonly known as:  TYLENOL PM  Take 1 tablet by mouth at bedtime as needed.     fexofenadine 180 MG tablet  Commonly known as:  ALLEGRA  Take 180 mg by mouth at bedtime.     HUMALOG 100 UNIT/ML injection  Generic drug:  insulin lispro  Continuous insulin pump     losartan 25 MG tablet  Commonly known as:  COZAAR  Take 25 mg by mouth 2 (two) times daily.     meloxicam 15 MG tablet  Commonly known as:  MOBIC  Take 15 mg by mouth daily as needed for pain.     mometasone-formoterol 200-5 MCG/ACT Aero  Commonly known as:  DULERA  Inhale 2 puffs into the lungs 2 (two) times daily.     montelukast 10 MG tablet  Commonly known as:  SINGULAIR  TAKE 1 TABLET BY MOUTH EVERY NIGHT AT BEDTIME     PROVENTIL HFA 108 (90 BASE) MCG/ACT inhaler  Generic drug:  albuterol  Inhale 2 puffs into  the lungs every 4 (four) hours as needed.     simvastatin 10 MG tablet  Commonly known as:  ZOCOR  TAKE 1 TABLET BY MOUTH EVERY NIGHT AT BEDTIME     VITAMIN D-3 PO  Take 1 tablet by mouth daily.           Objective:   Physical Exam BP 128/64 mmHg  Pulse 82  Temp(Src) 98.1 F (36.7 C) (Oral)  Ht 5\' 8"  (1.727 m)  Wt 161 lb 6 oz (73.199 kg)  BMI 24.54 kg/m2  SpO2 98%  General:   Well developed, well nourished . NAD.  HEENT:  Normocephalic . Face symmetric, atraumatic Lungs:  CTA B Normal respiratory effort, no intercostal retractions, no accessory muscle use. Heart: RRR (occ skips),  no murmur.  Muscle skeletal: no pretibial edema bilaterally  Skin: Not pale. Not jaundice Neurologic:  alert & oriented X3.  Speech normal, gait appropriate for age and unassisted Psych--  Cognition and judgment appear intact.  Cooperative with normal attention span and concentration.  Behavior appropriate. No anxious or depressed appearing.       Assessment & Plan:

## 2014-05-30 NOTE — Patient Instructions (Signed)
Take losartan twice a day Hold Dulera for one week  Check the  blood pressure 2 or 3 times a  Week   Be sure your blood pressure is between 110/65 and  145/85.  if it is consistently higher or lower, let me know    Come back for blood work in 2 weeks: BMP, hypertension  Next visit in 3 months

## 2014-05-30 NOTE — Progress Notes (Signed)
Pre visit review using our clinic review tool, if applicable. No additional management support is needed unless otherwise documented below in the visit note. 

## 2014-05-30 NOTE — Assessment & Plan Note (Signed)
BP slightly elevated this week, the only think she's doing differing is taking Dulera, unclear if that is the culprit. EKG-PACs Plan:  Continue losartan, increased from 1 tablet daily to one tablet twice a day Monitor BPs BMP in 2 weeks Come back in 3 months Hold Dulera for one week only to see if that has any impact on her  blood pressure

## 2014-06-03 ENCOUNTER — Telehealth: Payer: Self-pay | Admitting: Internal Medicine

## 2014-06-03 ENCOUNTER — Other Ambulatory Visit: Payer: Self-pay | Admitting: Internal Medicine

## 2014-06-03 MED ORDER — LOSARTAN POTASSIUM 25 MG PO TABS: 25.0000 mg | ORAL_TABLET | Freq: Two times a day (BID) | ORAL | Status: DC

## 2014-06-03 NOTE — Telephone Encounter (Signed)
Losartan Rx sent to [facility] as requested.

## 2014-06-03 NOTE — Telephone Encounter (Signed)
She was in last week to see Dr Larose Kells  He said to double her losartin.  She has called her pharmacy and no new prescription has been received by them.  She will be out of meds soon.  Please send the rx for the adjusted dose to [facility] [address]

## 2014-06-06 ENCOUNTER — Other Ambulatory Visit: Payer: Self-pay

## 2014-06-06 DIAGNOSIS — I10 Essential (primary) hypertension: Secondary | ICD-10-CM

## 2014-06-13 ENCOUNTER — Other Ambulatory Visit: Payer: 59

## 2014-06-17 ENCOUNTER — Encounter: Payer: Self-pay | Admitting: Internal Medicine

## 2014-06-17 ENCOUNTER — Ambulatory Visit (INDEPENDENT_AMBULATORY_CARE_PROVIDER_SITE_OTHER): Payer: 59 | Admitting: Internal Medicine

## 2014-06-17 VITALS — BP 140/76 | HR 85 | Ht 68.0 in | Wt 164.4 lb

## 2014-06-17 DIAGNOSIS — J4540 Moderate persistent asthma, uncomplicated: Secondary | ICD-10-CM

## 2014-06-17 MED ORDER — ALBUTEROL SULFATE HFA 108 (90 BASE) MCG/ACT IN AERS: INHALATION_SPRAY | RESPIRATORY_TRACT | Status: DC

## 2014-06-17 MED ORDER — MOMETASONE FURO-FORMOTEROL FUM 200-5 MCG/ACT IN AERO: INHALATION_SPRAY | RESPIRATORY_TRACT | Status: DC

## 2014-06-17 NOTE — Progress Notes (Addendum)
Subjective:    Patient ID: April Little, female    DOB: 06/24/1950    MRN: [id number]    Brief patient profile:  59 yowf quit smoking in 1981 with dx of asthma p quit with last eval by Dr Joya Gaskins 07/2010:    The patient complains of history of diagnosed Asthma, cough, shortness of breath, chest tightness, and wheezing, but denies chest pain, mucous production, nocturnal awakening, exercise induced symptoms, and congestion. Symptoms appear triggered by exercise, allergen:, and irritant:. The patient also has the following associated problems: allergic rhinitis, nasal congestion, difficulty breathing through nose, productive cough, chest pain, and chest tightness. Previous effective treatment includes short acting beta-agonist:, ICS + LABA, leukotriene agonist, and oral steroids. Asthma monitoring and treatment delivery to date has included: no home nebulizer and no PF meter. Asthma severity risk factors include: ER visits in last year:.  THis pt has been Dx asthma x 72yrs prior to OV   The asthma is is not seasonal  The pt uses albuterol once daily and ave twic e in one week  The past allergy testing rast positive [place] and trees  The pt rx advair and albuterol and singulair and allegra is daily  The pt only uses advair as needed  rec Try magic mouthwash 62ml three times daily,  Gargle and expectorate Azithromycin 2 tabs once then 1 daily until gone Increase dulera to two puff twice daily , use spacer       History of Present Illness   04/22/2014 1st [facility] office visit [facility] / [place]   Chief Complaint  Patient presents with  . Pulmonary Consult    Referred by Dr. Kathlene November for eval of asthma. Pt c/o chest congestion- started in the Fall of 2015. She also c/o DOE and cough. Cough is prod with yellow sputum.  She gets SOB when she plays tennis and when walking up the stairs in her home.   not consistent with maintenance rx - admits never has been  Cough is worse at hs / zpak  made it better  On chronic singulair not clear it helps Not taking dulera 200 consistently enough to tell whether helping  Pattern is highly variable  X > 5 years, flares sub acutely and needs to know how to manage it better but always sob with exercise x Fall  2015  rec dulera 100 Take 2 puffs first thing in am and then another 2 puffs about 12 hours later.  Only use your albuterol as a rescue medication    05/06/2014 NP  Follow up : Asthma  Returns for 2 week follow up for Asthma  We reviewed all her meds and organized them into a med calendar with pt education  Appears to be taking correctly.  Last ov instructed to use Dulera 100 on reg basis  Feels better with less DOE and resolved cough and wheezing.  Spirometry today shows FEV1 60%, ratio 64, FVC 72%,   Pt education on asthma treatment and control  She does complain of less energy for last several months.   rec Increase Dulera 200 2 puffs Twice daily  , rinse after use.  Change Zyrtec to Allegra daily  Follow med calendar closely and bring to each visit.    06/17/2014 f/u ov/Hideo Googe re: chronic asthma / pt not consistent with taking dulera but feels fine and main goal of ov is to "get off some of these meds" Chief Complaint  Patient presents with  . Follow-up  Pt states that her breathing is doing well and cough not bothering her at this time. She has not had to use rescue inhaler.   denies getting med calendar with action plan   Not limited by breathing from desired activities    No obvious day to day or daytime variabilty or assoc chronic cough or cp or chest tightness, subjective wheeze overt sinus or hb symptoms. No unusual exp hx or h/o childhood pna/ asthma or knowledge of premature birth.  Sleeping ok without nocturnal  or early am exacerbation  of respiratory  c/o's or need for noct saba. Also denies any obvious fluctuation of symptoms with weather or environmental changes or other aggravating or alleviating factors except  as outlined above   Current Medications, Allergies, Complete Past Medical History, Past Surgical History, Family History, and Social History were reviewed in [employer] record.  ROS  The following are not active complaints unless bolded sore throat, dysphagia, dental problems, itching, sneezing,  nasal congestion or excess/ purulent secretions, ear ache,   fever, chills, sweats, unintended wt loss, pleuritic or exertional cp, hemoptysis,  orthopnea pnd or leg swelling, presyncope, palpitations, heartburn, abdominal pain, anorexia, nausea, vomiting, diarrhea  or change in bowel or urinary habits, change in stools or urine, dysuria,hematuria,  rash, arthralgias, visual complaints, headache, numbness weakness or ataxia or problems with walking or coordination,  change in mood/affect or memory.                     Objective:   Physical Exam    wf nad     06/17/2014         165  Wt Readings from Last 3 Encounters:  05/30/14 161 lb 6 oz (73.199 kg)  05/06/14 165 lb (74.844 kg)  04/22/14 160 lb 12.8 oz (72.938 kg)    Vital signs reviewed    HEENT: nl dentition, turbinates, and orophanx. Nl external ear canals without cough reflex   NECK :  without JVD/Nodes/TM/ nl carotid upstrokes bilat  LUNGS: no acc muscle use,  A few bilateral insp and exp wheezes present but ok air movement overall    CV:  RRR  no s3 or murmur or increase in P2, no edema   ABD:  soft and nontender with nl excursion in the supine position. No bruits or organomegaly, bowel sounds nl  MS:  warm without deformities, calf tenderness, cyanosis or clubbing  SKIN: warm and dry without lesions    NEURO:  alert, approp, no deficits    CXR PA and Lateral:   04/22/2014 :     I personally reviewed images and agree with radiology impression as follows:     No evidence of acute cardiopulmonary disease.     Assessment & Plan:

## 2014-06-17 NOTE — Patient Instructions (Signed)
Ok to use the dulera up  To 2 puffs every 12 hours as long not doing well with tennis and steps   The other option is qvar 80 2 every 12 hours used perfectly regularly   Only use your albuterol as a rescue medication to be used if you can't catch your breath by resting or doing a relaxed purse lip breathing pattern.  - The less you use it, the better it will work when you need it. - Ok to use up to 2 puffs  every 4 hours if you must but call for immediate appointment if use goes up over your usual need - Don't leave home without it !!  (think of it like the spare tire for your car)   Only use allegra as needed for runny nose  Ok to stop singulair but if having freq flares of breathing probems or nasal symptoms start back up    If you are satisfied with your treatment plan,  let your doctor know and he/she can either refill your medications or you can return here when your prescription runs out.     If in any way you are not 100% satisfied,  please tell us.  If 100% better, tell your friends!  Pulmonary follow up is as needed

## 2014-06-18 ENCOUNTER — Other Ambulatory Visit: Payer: Self-pay | Admitting: Internal Medicine

## 2014-06-18 ENCOUNTER — Encounter: Payer: Self-pay | Admitting: Internal Medicine

## 2014-06-18 ENCOUNTER — Other Ambulatory Visit: Payer: Self-pay

## 2014-06-18 NOTE — Assessment & Plan Note (Signed)
-   04/22/2014 p extensive coaching HFA effectiveness =    90% > try dulera 100 2bid  - spirometry 05/06/14 FEV1  1.61 (60%) ratio 64  - 06/17/14 declined maint rx so best option = dulera 200 2bid and try off singulair   The proper method of use, as well as anticipated side effects, of a metered-dose inhaler are discussed and demonstrated to the patient. Improved effectiveness after extensive coaching during this visit to a level of approximately  90% from a baseline of 75%    I had an extended discussion with the patient reviewing all relevant studies completed to date and  lasting 15 to 20 minutes of a 25 minute visit on the following ongoing concerns:  1) she clearly has evidence of chronic moderate asthma for which she can't tell any of her meds are making any difference and clearly the most important has been the addtion of ics, even if not taking it consistently  2) doubt singulair adding much > ok to try off  3) we were using her ability to play tennis/ climb steps as a measure of her symptoms control as she does not appear to be prone to acute exac so reviewed this issue  with her in detail eg doing great means playing tennis and climbing steps s difficulty breathing or needing saba more than twice weekly  4) Each maintenance medication was reviewed in detail including most importantly the difference between maintenance and as needed and under what circumstances the prns are to be used.  Please see instructions for details which were reviewed in writing and the patient given a copy.    5) pulmonary f/u is prn

## 2014-06-23 ENCOUNTER — Other Ambulatory Visit: Payer: Self-pay | Admitting: Internal Medicine

## 2014-07-09 ENCOUNTER — Other Ambulatory Visit: Payer: Self-pay

## 2014-07-09 DIAGNOSIS — Z0000 Encounter for general adult medical examination without abnormal findings: Secondary | ICD-10-CM

## 2014-07-24 ENCOUNTER — Telehealth: Payer: Self-pay | Admitting: Internal Medicine

## 2014-07-24 NOTE — Telephone Encounter (Signed)
I do not see any contraindications for Pt not to have Tdap injection. Pt may schedule a nurse visit appt at her convenience or she may get at a local pharmacy.

## 2014-07-24 NOTE — Telephone Encounter (Signed)
Relation to pt: self  Call back number: [phone]    Reason for call: pt daughter is having a baby this month and pt would like the TDAP. Please advise

## 2014-07-25 NOTE — Telephone Encounter (Signed)
Advised pt spouse, pt will call to schedule T-DAP with nurse

## 2014-07-29 ENCOUNTER — Ambulatory Visit: Payer: 59

## 2014-08-29 ENCOUNTER — Ambulatory Visit (INDEPENDENT_AMBULATORY_CARE_PROVIDER_SITE_OTHER): Payer: 59 | Admitting: Internal Medicine

## 2014-08-29 ENCOUNTER — Encounter: Payer: Self-pay | Admitting: Internal Medicine

## 2014-08-29 VITALS — BP 122/72 | HR 70 | Temp 98.2°F | Ht 68.0 in | Wt 164.5 lb

## 2014-08-29 DIAGNOSIS — I10 Essential (primary) hypertension: Secondary | ICD-10-CM

## 2014-08-29 DIAGNOSIS — J4540 Moderate persistent asthma, uncomplicated: Secondary | ICD-10-CM | POA: Diagnosis not present

## 2014-08-29 MED ORDER — SIMVASTATIN 10 MG PO TABS: 10.0000 mg | ORAL_TABLET | Freq: Every day | ORAL | Status: DC

## 2014-08-29 NOTE — Progress Notes (Signed)
Pre visit review using our clinic review tool, if applicable. No additional management support is needed unless otherwise documented below in the visit note. 

## 2014-08-29 NOTE — Progress Notes (Signed)
Subjective:    Patient ID: April Little, female    DOB: 01-04-1951, 64 y.o.   MRN: [id number]  DOS:  08/29/2014 Type of visit - description : Routine visit Interval history:  HTN, good compliance w/ losartan, ambulatory BPs are excellent. History of asthma, note from pulmonary reviewed, she's taking medications as needed  Review of Systems   Past Medical History  Diagnosis Date  . Diabetes type I     on a pump , f/u Dr Buddy Duty  . Asthma   . Hyperlipidemia   . Menopause     age 77    Past Surgical History  Procedure Laterality Date  . Cervical cone biopsy      in her 30s  . Knee arthroscopy  11-16-2011    L    History   Social History  . Marital Status: Married    Spouse Name: N/A  . Number of Children: 2  . 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 Smoking status: Former Smoker -- 3.00 packs/day for 12 years    Types: Cigarettes    Quit date: 02/15/1979  . Smokeless tobacco: Never Used  . Alcohol Use: 0.0 oz/week    0 Standard drinks or equivalent per week     Comment: occasional  . Drug Use: No  . Sexual Activity: Not on file   Other Topics Concern  . Not on file   Social History Narrative   Plays tennis         Medication List       This list is accurate as of: 08/29/14  2:16 PM.  Always use your most recent med list.               albuterol 108 (90 BASE) MCG/ACT inhaler  Commonly known as:  PROAIR HFA  2 puffs every 4 hours as needed only  if your can't catch your breath     aspirin 81 MG tablet  Take 81 mg by mouth daily.     bisacodyl 5 MG EC tablet  Commonly known as:  DULCOLAX  Take 5 mg by mouth daily as needed for moderate constipation.     dextromethorphan 30 MG/5ML liquid  Commonly known as:  DELSYM  2 tsp every 12 hours as needed     diphenhydramine-acetaminophen 25-500 MG Tabs  Commonly known as:  TYLENOL PM  Take 1 tablet by mouth at bedtime as needed.     fexofenadine 180 MG tablet  Commonly known as:  ALLEGRA  Take 180 mg by mouth at bedtime.     HUMALOG 100 UNIT/ML injection  Generic drug:  insulin lispro  Continuous insulin pump     losartan 25 MG tablet  Commonly known as:  COZAAR  Take 1 tablet (25 mg total) by mouth 2 (two) times daily.     meloxicam 15 MG tablet  Commonly known as:  MOBIC  Take 15 mg by mouth daily as needed for pain.     mometasone-formoterol 200-5 MCG/ACT Aero  Commonly known as:  DULERA  Take 2 puffs first thing in am and then another 2 puffs about 12 hours later.     simvastatin 10 MG tablet  Commonly known as:  ZOCOR  Take 1 tablet (10 mg total) by mouth at bedtime.     VITAMIN D-3 PO  Take 1 tablet by mouth daily.  Objective:   Physical Exam BP 122/72 mmHg  Pulse 70  Temp(Src) 98.2 F (36.8 C) (Oral)  Ht 5\' 8"  (1.727 m)  Wt 164 lb 8 oz (74.617 kg)  BMI 25.02 kg/m2  SpO2 96% General:   Well developed, well nourished . NAD.  HEENT:  Normocephalic . Face symmetric, atraumatic Lungs:  few rhonchi and few end expiratory wheezing Normal respiratory effort, no intercostal retractions, no accessory muscle use. Heart: RRR,  no murmur.  No pretibial edema bilaterally  Skin: Not pale. Not jaundice Neurologic:  alert & oriented X3.  Speech normal, gait appropriate for age and unassisted Psych--  Cognition and judgment appear intact.  Cooperative with normal attention span and concentration.  Behavior appropriate. No anxious or depressed appearing.        Assessment & Plan:

## 2014-08-31 NOTE — Assessment & Plan Note (Signed)
Asthma: Currently using dulera and albuterol as needed only We had extensive discussion about how to use her medication, answered to the best of my ability, recommend to continue use meds as needed for cough, wheezing and shortness of breath but use dulera persistently if symptoms persist.   Today , I spent more than 18   min with the patient: >50% of the time counseling regards appropriate use of asthma medication

## 2014-08-31 NOTE — Assessment & Plan Note (Signed)
Hypertension: At the last visit we increased losartan, BP today is excellent, reports normal ambulatory BPs. Chart reviewed, BMP at the endocrinology in April 2016 normal. Plan: No change, follow-up in 6 months.

## 2014-09-17 LAB — HEMOGLOBIN A1C: Hgb A1c MFr Bld: 7.8 % — AB (ref 4.0–6.0)

## 2014-09-22 ENCOUNTER — Other Ambulatory Visit: Payer: Self-pay

## 2014-09-30 ENCOUNTER — Encounter: Payer: 59 | Attending: Internal Medicine | Admitting: *Deleted

## 2014-09-30 DIAGNOSIS — Z713 Dietary counseling and surveillance: Secondary | ICD-10-CM | POA: Insufficient documentation

## 2014-09-30 DIAGNOSIS — E109 Type 1 diabetes mellitus without complications: Secondary | ICD-10-CM | POA: Insufficient documentation

## 2014-09-30 DIAGNOSIS — Z794 Long term (current) use of insulin: Secondary | ICD-10-CM | POA: Diagnosis not present

## 2014-10-05 ENCOUNTER — Encounter: Payer: Self-pay | Admitting: *Deleted

## 2014-10-05 NOTE — Progress Notes (Signed)
Insulin Pump Evaluation Visit:  Date: 09/30/14   Appt start time: 1430 end time:  1530.  Assessment:  This patient has DM 1 and their primary concerns today: more information on other pump companies and options for CGM considering she will be going on Medicare in 6 months.April Little   MEDICATIONS: She is currently on Medtronic insulin pump. She has not tried CGM yet.  She uses the Bolus Wizard for meal and correction dosing  Usual physical activity: she plays tennis regularly, both singles and doubles  Last A1c was 7.8%  Patient states they have had hypoglycemia more often related to increased activity with tennis, though she states less often lately Patient states their biggest barrier with diabetes is high cost of pump and supplies     Intervention:    We discussed that her [place] insurance now only covers Medtronic pumps. When she goes on Medicare, she will have other options. Her current Medtronic pump is out of warranty, so she is eligible to upgrade now and has been approved by her insurance to move forward, but the out of pocket cost is high in her opinion. I discussed with her that as long as her pump is operating properly, she is not obligated to purchase another pump, but once it fails, Medtronic will give her 3 months to purchase a new one.   Showed patient the following pumps:Medtronic and OmniPod. She was interested in the lack of tubing with the [place] with her tennis and her active lifestyle. Her daily insulin total is less than 50 units / day so the 200 unit pod would allow her to change out every 3 days.  Discussed current Continuous Glucose Monitoring options. She states she does not mind the testing now and knows that Medicare will not cover CGM so the cost is a concern to her. I informed her of the application by Dex[employer] to the FDA for use of CGM in place of blood testing and how that may lead to coverage by Medicare in the future.  Cautioned her against taking her pump off for  more than 2 hours when playing tennis to reduce risk of ketones.   Discussed better use of Temp Basal on her current pump by starting it 30 minutes prior to exercise, include duration of exercise plus an additional 30 -60 minutes after the exercise to better match insulin action and physiology of exercise to her basal insulin delivery.   Informed her of the DM 1 Support Group  Handouts given during visit include:  DM 1 Support Group Flyer  Insulin Pump Packet from [employer] per patient request  Hand written explanation of use of Temp Basal  Monitoring/Evaluation:    Patient to contact local [place] so they can discuss and decide if she wants to start the process of obtaining the pump. Contact information provided to the patient.

## 2014-12-12 ENCOUNTER — Other Ambulatory Visit: Payer: Self-pay | Admitting: Internal Medicine

## 2014-12-13 ENCOUNTER — Other Ambulatory Visit: Payer: Self-pay | Admitting: Internal Medicine

## 2015-01-06 ENCOUNTER — Other Ambulatory Visit: Payer: Self-pay | Admitting: Internal Medicine

## 2015-01-14 ENCOUNTER — Other Ambulatory Visit: Payer: Self-pay | Admitting: Internal Medicine

## 2015-01-21 ENCOUNTER — Telehealth: Payer: Self-pay | Admitting: Internal Medicine

## 2015-01-21 MED ORDER — LOSARTAN POTASSIUM 25 MG PO TABS: 25.0000 mg | ORAL_TABLET | Freq: Two times a day (BID) | ORAL | Status: DC

## 2015-01-21 NOTE — Telephone Encounter (Signed)
Caller name: Pharmacy    Pharmacy:  [id number] - [place], [address] AT [facility] OF [address] [phone] (Phone) [phone] (Fax)         Reason for call: Pharmacy states they never received  a refill on losartan (COZAAR) 25 MG tablet [id number]

## 2015-01-21 NOTE — Telephone Encounter (Signed)
Rx resent.

## 2015-01-21 NOTE — Telephone Encounter (Signed)
Medication Detail       Disp Refills Start End      losartan (COZAAR) 25 MG tablet 180 tablet 5 06/03/2014      Sig - Route: Take 1 tablet (25 mg total) by mouth 2 (two) times daily. - Oral     E-Prescribing Status: Receipt confirmed by pharmacy (06/03/2014 12:22 PM EDT)     However, will resend Rx.

## 2015-04-28 DIAGNOSIS — E1042 Type 1 diabetes mellitus with diabetic polyneuropathy: Secondary | ICD-10-CM | POA: Diagnosis not present

## 2015-04-28 DIAGNOSIS — E1065 Type 1 diabetes mellitus with hyperglycemia: Secondary | ICD-10-CM | POA: Diagnosis not present

## 2015-04-28 DIAGNOSIS — Z794 Long term (current) use of insulin: Secondary | ICD-10-CM | POA: Diagnosis not present

## 2015-04-28 LAB — BASIC METABOLIC PANEL
BUN: 13 mg/dL (ref 4–21)
CREATININE: 0.8 mg/dL (ref 0.5–1.1)
Glucose: 96 mg/dL
POTASSIUM: 4 mmol/L (ref 3.4–5.3)
SODIUM: 140 mmol/L (ref 137–147)

## 2015-04-28 LAB — HEPATIC FUNCTION PANEL
ALT: 19 U/L (ref 7–35)
AST: 21 U/L (ref 13–35)
Alkaline Phosphatase: 79 U/L (ref 25–125)
BILIRUBIN, TOTAL: 0.4 mg/dL

## 2015-04-28 LAB — MICROALBUMIN, URINE: Microalb, Ur: <0.7

## 2015-04-28 LAB — HEMOGLOBIN A1C: HEMOGLOBIN A1C: 9

## 2015-04-28 LAB — HM DIABETES FOOT EXAM

## 2015-04-28 LAB — LIPID PANEL
Cholesterol: 180 mg/dL (ref 0–200)
HDL: 99 mg/dL — AB (ref 35–70)
LDL Cholesterol: 73 mg/dL
LDL/HDL RATIO: 1.8
TRIGLYCERIDES: 46 mg/dL (ref 40–160)

## 2015-04-28 LAB — TSH: TSH: 3.12 u[IU]/mL (ref 0.41–5.90)

## 2015-05-04 ENCOUNTER — Encounter: Payer: Self-pay | Admitting: Internal Medicine

## 2015-05-04 ENCOUNTER — Ambulatory Visit (INDEPENDENT_AMBULATORY_CARE_PROVIDER_SITE_OTHER): Payer: Medicare Other | Admitting: Internal Medicine

## 2015-05-04 VITALS — BP 122/70 | HR 73 | Temp 98.0°F | Ht 67.0 in | Wt 163.2 lb

## 2015-05-04 DIAGNOSIS — Z01419 Encounter for gynecological examination (general) (routine) without abnormal findings: Secondary | ICD-10-CM

## 2015-05-04 DIAGNOSIS — Z1239 Encounter for other screening for malignant neoplasm of breast: Secondary | ICD-10-CM

## 2015-05-04 DIAGNOSIS — Z124 Encounter for screening for malignant neoplasm of cervix: Secondary | ICD-10-CM

## 2015-05-04 DIAGNOSIS — Z0000 Encounter for general adult medical examination without abnormal findings: Secondary | ICD-10-CM | POA: Diagnosis not present

## 2015-05-04 DIAGNOSIS — Z1231 Encounter for screening mammogram for malignant neoplasm of breast: Secondary | ICD-10-CM

## 2015-05-04 DIAGNOSIS — Z780 Asymptomatic menopausal state: Secondary | ICD-10-CM

## 2015-05-04 DIAGNOSIS — Z09 Encounter for follow-up examination after completed treatment for conditions other than malignant neoplasm: Secondary | ICD-10-CM | POA: Insufficient documentation

## 2015-05-04 DIAGNOSIS — E785 Hyperlipidemia, unspecified: Secondary | ICD-10-CM | POA: Diagnosis not present

## 2015-05-04 DIAGNOSIS — I10 Essential (primary) hypertension: Secondary | ICD-10-CM

## 2015-05-04 NOTE — Patient Instructions (Signed)
Next visit 1 year   Please consider visit these websites for more information:  www.begintheconversation.org  theconversationproject.org    Fall Prevention and Home Safety Falls cause injuries and can affect all age groups. It is possible to use preventive measures to significantly decrease the likelihood of falls. There are many simple measures which can make your home safer and prevent falls. OUTDOORS  Repair cracks and edges of walkways and driveways.  Remove high doorway thresholds.  Trim shrubbery on the main path into your home.  Have good outside lighting.  Clear walkways of tools, rocks, debris, and clutter.  Check that handrails are not broken and are securely fastened. Both sides of steps should have handrails.  Have leaves, snow, and ice cleared regularly.  Use sand or salt on walkways during winter months.  In the garage, clean up grease or oil spills. BATHROOM  Install night lights.  Install grab bars by the toilet and in the tub and shower.  Use non-skid mats or decals in the tub or shower.  Place a plastic non-slip stool in the shower to sit on, if needed.  Keep floors dry and clean up all water on the floor immediately.  Remove soap buildup in the tub or shower on a regular basis.  Secure bath mats with non-slip, double-sided rug tape.  Remove throw rugs and tripping hazards from the floors. BEDROOMS  Install night lights.  Make sure a bedside light is easy to reach.  Do not use oversized bedding.  Keep a telephone by your bedside.  Have a firm chair with side arms to use for getting dressed.  Remove throw rugs and tripping hazards from the floor. KITCHEN  Keep handles on pots and pans turned toward the center of the stove. Use back burners when possible.  Clean up spills quickly and allow time for drying.  Avoid walking on wet floors.  Avoid hot utensils and knives.  Position shelves so they are not too high or low.  Place  commonly used objects within easy reach.  If necessary, use a sturdy step stool with a grab bar when reaching.  Keep electrical cables out of the way.  Do not use floor polish or wax that makes floors slippery. If you must use wax, use non-skid floor wax.  Remove throw rugs and tripping hazards from the floor. STAIRWAYS  Never leave objects on stairs.  Place handrails on both sides of stairways and use them. Fix any loose handrails. Make sure handrails on both sides of the stairways are as long as the stairs.  Check carpeting to make sure it is firmly attached along stairs. Make repairs to worn or loose carpet promptly.  Avoid placing throw rugs at the top or bottom of stairways, or properly secure the rug with carpet tape to prevent slippage. Get rid of throw rugs, if possible.  Have an electrician put in a light switch at the top and bottom of the stairs. OTHER FALL PREVENTION TIPS  Wear low-heel or rubber-soled shoes that are supportive and fit well. Wear closed toe shoes.  When using a stepladder, make sure it is fully opened and both spreaders are firmly locked. Do not climb a closed stepladder.  Add color or contrast paint or tape to grab bars and handrails in your home. Place contrasting color strips on first and last steps.  Learn and use mobility aids as needed. Install an electrical emergency response system.  Turn on lights to avoid dark areas. Replace light bulbs that  burn out immediately. Get light switches that glow.  Arrange furniture to create clear pathways. Keep furniture in the same place.  Firmly attach carpet with non-skid or double-sided tape.  Eliminate uneven floor surfaces.  Select a carpet pattern that does not visually hide the edge of steps.  Be aware of all pets. OTHER HOME SAFETY TIPS  Set the water temperature for 120 F (48.8 C).  Keep emergency numbers on or near the telephone.  Keep smoke detectors on every level of the home and near  sleeping areas. Document Released: 01/21/2002 Document Revised: 08/02/2011 Document Reviewed: 04/22/2011 [facility] Patient Information 2015 [place], [place]. This information is not intended to replace advice given to you by your health care provider. Make sure you discuss any questions you have with your health care provider.   Preventive Care for Adults Ages 61 and over  Blood pressure check.** / Every 1 to 2 years.  Lipid and cholesterol check.**/ Every 5 years beginning at age 32.  Lung cancer screening. / Every year if you are aged 44-80 years and have a 30-pack-year history of smoking and currently smoke or have quit within the past 15 years. Yearly screening is stopped once you have quit smoking for at least 15 years or develop a health problem that would prevent you from having lung cancer treatment.  Fecal occult blood test (FOBT) of stool. / Every year beginning at age 69 and continuing until age 55. You may not have to do this test if you get a colonoscopy every 10 years.  Flexible sigmoidoscopy** or colonoscopy.** / Every 5 years for a flexible sigmoidoscopy or every 10 years for a colonoscopy beginning at age 48 and continuing until age 20.  Hepatitis C blood test.** / For all people born from [birth year] through 1965 and any individual with known risks for hepatitis C.  Abdominal aortic aneurysm (AAA) screening.** / A one-time screening for ages 20 to 36 years who are current or former smokers.  Skin self-exam. / Monthly.  Influenza vaccine. / Every year.  Tetanus, diphtheria, and acellular pertussis (Tdap/Td) vaccine.** / 1 dose of Td every 10 years.  Varicella vaccine.** / Consult your health care provider.  Zoster vaccine.** / 1 dose for adults aged 65 years or older.  Pneumococcal 13-valent conjugate (PCV13) vaccine.** / Consult your health care provider.  Pneumococcal polysaccharide (PPSV23) vaccine.** / 1 dose for all adults aged 40 years and older.  Meningococcal  vaccine.** / Consult your health care provider.  Hepatitis A vaccine.** / Consult your health care provider.  Hepatitis B vaccine.** / Consult your health care provider.  Haemophilus influenzae type b (Hib) vaccine.** / Consult your health care provider. **Family history and personal history of risk and conditions may change your health care provider's recommendations. Document Released: 03/29/2001 Document Revised: 02/05/2013 Document Reviewed: 06/28/2010 [facility] Patient Information 2015 [place], [place]. This information is not intended to replace advice given to you by your health care provider. Make sure you discuss any questions you have with your health care provider.

## 2015-05-04 NOTE — Progress Notes (Signed)
Subjective:    Patient ID: April Little, female    DOB: 1950-12-16, 65 y.o.   MRN: [id number]  DOS:  05/04/2015 Type of visit - description :  Here for Medicare AWV:  1. Risk factors based on Past M, S, F history: reviewed 2. Physical Activities:  Very active, tennis 3. Depression/mood: neg screening  4. Hearing:  No problems noted or reported  5. ADL's: independent, drives  6. Fall Risk: no recent falls, prevention discussed , see AVS 7. home Safety: does feel safe at home  8. Height, weight, & visual acuity: see VS, sees eye doctor regulalrly 9. Counseling: provided 10. Labs ordered based on risk factors: if needed  11. Referral Coordination: if needed 12. Care Plan, see assessment and plan , written personalized plan provided , see AVS 13. Cognitive Assessment: motor skills and cognition appropriate for age 23. Care team updated 15. End-of-life care - discussed   In addition, today we discussed the following: Diabetes: Follow-up by endocrinology. High cholesterol: Good compliance with medications. Self discontinue aspirin due to easy bruising.  Labs 04/28/2015: Cholesterol 180, triglycerides 46, LDL 73 TSH 3.1 Creatinine 0.81, potassium 4.0, AST, ALT normal Microalbumin negative  Review of Systems Constitutional: No fever. No chills. No unexplained wt changes. No unusual sweats  HEENT: No dental problems, no ear discharge, no facial swelling, no voice changes. No eye discharge, no eye  redness , no  intolerance to light   Respiratory: No wheezing , no  difficulty breathing. No cough , no mucus production  Cardiovascular: No CP, no leg swelling , no  Palpitations  GI: no nausea, no vomiting, no diarrhea , no  abdominal pain.  No blood in the stools. No dysphagia, no odynophagia    Endocrine: No polyphagia, no polyuria , no polydipsia  GU: No dysuria, gross hematuria, difficulty urinating. No urinary urgency, no frequency.  Musculoskeletal: No joint swellings  or unusual aches or pains  Skin: No change in the color of the skin, palor , no  Rash  Allergic, immunologic:  occasional  environmental allergies on OTC antihistaminics , no  food allergies  Neurological: No dizziness no  syncope. No headaches. No diplopia, no slurred, no slurred speech, no motor deficits, no facial  Numbness  Hematological: No enlarged lymph nodes, no easy bruising , no unusual bleedings  Psychiatry: No suicidal ideas, no hallucinations, no beavior problems, no confusion.  No unusual/severe anxiety, no depression   Past Medical History  Diagnosis Date  . Diabetes type I ([place])     on a pump , f/u Dr Buddy Duty  . Asthma   . Hyperlipidemia   . Menopause     age 8    Past Surgical History  Procedure Laterality Date  . Cervical cone biopsy      in her 24s  . Knee arthroscopy  11-16-2011    L  . Cataract extraction  ~ 2016    R    Social History   Social History  . Marital Status: Married    Spouse Name: N/A  . Number of Children: 2  . 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 Smoking status: Former Smoker -- 3.00 packs/day for 12 years    Types: Cigarettes    Quit date: 02/15/1979  . Smokeless tobacco: Never Used  . Alcohol Use: 0.0 oz/week    0 Standard drinks or equivalent per week  Comment: occasional  . Drug Use: No  . Sexual Activity: Not on file   Other Topics Concern  . Not on file   Social History Narrative   Plays tennis ; has a disable son that depends on her      Family History  Problem Relation Age of Onset  . Coronary artery disease Mother     M, B (MI age 84), B (age 6)  . Diabetes      several w/ type II  . Cirrhosis Father   . Colon cancer Neg Hx   . Prostate cancer Neg Hx   . Breast cancer Neg Hx   . Asthma Brother   . COPD Sister     smoked       Medication List       This list is accurate as of: 05/04/15  5:20 PM.  Always use your most recent med  list.               albuterol 108 (90 Base) MCG/ACT inhaler  Commonly known as:  PROAIR HFA  2 puffs every 4 hours as needed only  if your can't catch your breath     aspirin 81 MG tablet  Take 81 mg by mouth daily.     bisacodyl 5 MG EC tablet  Commonly known as:  DULCOLAX  Take 5 mg by mouth daily as needed for moderate constipation.     dextromethorphan 30 MG/5ML liquid  Commonly known as:  DELSYM  Reported on 05/04/2015     diphenhydramine-acetaminophen 25-500 MG Tabs tablet  Commonly known as:  TYLENOL PM  Take 1 tablet by mouth at bedtime as needed.     fexofenadine 180 MG tablet  Commonly known as:  ALLEGRA  Take 180 mg by mouth at bedtime.     GLUCAGON EMERGENCY 1 MG injection  Generic drug:  glucagon  Inject 1 mg into the vein once as needed.     HUMALOG 100 UNIT/ML injection  Generic drug:  insulin lispro  Continuous insulin pump     LANTUS 100 UNIT/ML injection  Generic drug:  insulin glargine  Inject 0.26 mLs (26 Units total) into the skin as directed. When unable to use insulin pump, per Dr. Buddy Duty     losartan 25 MG tablet  Commonly known as:  COZAAR  Take 1 tablet (25 mg total) by mouth 2 (two) times daily.     meloxicam 15 MG tablet  Commonly known as:  MOBIC  Take 15 mg by mouth daily as needed for pain.     mometasone-formoterol 200-5 MCG/ACT Aero  Commonly known as:  DULERA  Take 2 puffs first thing in am and then another 2 puffs about 12 hours later.     simvastatin 10 MG tablet  Commonly known as:  ZOCOR  Take 1 tablet (10 mg total) by mouth at bedtime.     VITAMIN D-3 PO  Take 1 tablet by mouth daily.           Objective:   Physical Exam BP 122/70 mmHg  Pulse 73  Temp(Src) 98 F (36.7 C) (Oral)  Ht 5\' 7"  (1.702 m)  Wt 163 lb 4 oz (74.05 kg)  BMI 25.56 kg/m2  SpO2 92%  General:   Well developed, well nourished . NAD.  Neck: No  Thyromegaly  HEENT:  Normocephalic . Face symmetric, atraumatic Lungs:  CTA B Normal  respiratory effort, no intercostal retractions, no accessory muscle use. Heart: RRR,  no murmur.  No pretibial edema bilaterally  Abdomen:  Not distended, soft, non-tender. No rebound or rigidity.   Skin: Exposed areas without rash. Not pale. Not jaundice Neurologic:  alert & oriented X3.  Speech normal, gait appropriate for age and unassisted Strength symmetric and appropriate for age.  Psych: Cognition and judgment appear intact.  Cooperative with normal attention span and concentration.  Behavior appropriate. No anxious or depressed appearing.    Assessment & Plan:   Assessment DM type I Dr. Buddy Duty HTN Hyperlipidemia Asthma Menopause , age 49 + FH CAD  PLAN Recent labs reviewed DM: Per endocrinology, sees the eye doctor regularly HTN: Continue with losartan Dyslipidemia well controlled on simvastatin + From history of CAD, diabetes, hypertension, hyperlipidemia: self d/c ASA d/t excessive bruising, benefits of aspirin discussed, recommend to take 81 mg every other day. RTC one year.

## 2015-05-04 NOTE — Progress Notes (Signed)
Pre visit review using our clinic review tool, if applicable. No additional management support is needed unless otherwise documented below in the visit note. 

## 2015-05-04 NOTE — Assessment & Plan Note (Signed)
Recent labs reviewed DM: Per endocrinology, sees the eye doctor regularly HTN: Continue with losartan Dyslipidemia well controlled on simvastatin + From history of CAD, diabetes, hypertension, hyperlipidemia: self d/c ASA d/t excessive bruising, benefits of aspirin discussed, recommend to take 81 mg every other day. RTC one year.

## 2015-05-04 NOTE — Assessment & Plan Note (Signed)
Td 08; pneumonia shot 23 --> 11-10; PNM 13---2015; zostavax --2015  Female care h/o conization, several normal PAPs afterwards Pap-- --04/2012--neg-- Refer to gyn [facility]--05/2012--neg bi rads -- will schedule a mmg   CCS--04/2012--Dr Jacobs--next due 2024   DEXA 05-2009 Tscore -1.5, rec Ca and vit d --- check a  DEXA   Diet and exercise discussed

## 2015-05-19 ENCOUNTER — Telehealth: Payer: Self-pay

## 2015-05-19 DIAGNOSIS — E2839 Other primary ovarian failure: Secondary | ICD-10-CM

## 2015-05-19 NOTE — Telephone Encounter (Signed)
Bone density reordered.

## 2015-05-19 NOTE — Telephone Encounter (Signed)
-----   Message from Colon Branch, MD sent at 05/19/2015 11:19 AM EDT ----- Vilma Prader, could you please change the dx? 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 This is due to patient having Medicare insurance.   Thanks in advance! :)

## 2015-05-20 DIAGNOSIS — Z124 Encounter for screening for malignant neoplasm of cervix: Secondary | ICD-10-CM | POA: Diagnosis not present

## 2015-05-20 DIAGNOSIS — Z1231 Encounter for screening mammogram for malignant neoplasm of breast: Secondary | ICD-10-CM | POA: Diagnosis not present

## 2015-05-25 ENCOUNTER — Telehealth: Payer: Self-pay | Admitting: Internal Medicine

## 2015-05-25 DIAGNOSIS — J309 Allergic rhinitis, unspecified: Secondary | ICD-10-CM

## 2015-05-25 NOTE — Telephone Encounter (Signed)
Please advise 

## 2015-05-25 NOTE — Telephone Encounter (Signed)
Referral placed, LMOM informing Pt of recommendations.

## 2015-05-25 NOTE — Telephone Encounter (Signed)
°  Relation to [id number] Call back number:[phone]   Reason for call:  Extreme head and lung congestion, patient requesting a referral to allergist patient states this is a reoccurring issue. Please advise

## 2015-05-25 NOTE — Telephone Encounter (Signed)
Please arrange an allergist referral.  Advised patient that she feels is a bad bronchitis or cold or if she has fever -----> needs to be seen here.

## 2015-06-02 ENCOUNTER — Encounter: Payer: Self-pay | Admitting: Allergy and Immunology

## 2015-06-02 ENCOUNTER — Ambulatory Visit (INDEPENDENT_AMBULATORY_CARE_PROVIDER_SITE_OTHER): Payer: Medicare Other | Admitting: Allergy and Immunology

## 2015-06-02 ENCOUNTER — Other Ambulatory Visit: Payer: Self-pay | Admitting: Allergy and Immunology

## 2015-06-02 VITALS — BP 158/88 | HR 84 | Temp 97.6°F | Resp 18 | Ht 66.93 in | Wt 163.1 lb

## 2015-06-02 DIAGNOSIS — J309 Allergic rhinitis, unspecified: Secondary | ICD-10-CM | POA: Diagnosis not present

## 2015-06-02 DIAGNOSIS — H1010 Acute atopic conjunctivitis, unspecified eye: Secondary | ICD-10-CM

## 2015-06-02 DIAGNOSIS — J4540 Moderate persistent asthma, uncomplicated: Secondary | ICD-10-CM

## 2015-06-02 MED ORDER — FLUTICASONE PROPIONATE HFA 220 MCG/ACT IN AERO: 2.0000 | INHALATION_SPRAY | Freq: Two times a day (BID) | RESPIRATORY_TRACT | Status: DC

## 2015-06-02 MED ORDER — ALBUTEROL SULFATE HFA 108 (90 BASE) MCG/ACT IN AERS: INHALATION_SPRAY | RESPIRATORY_TRACT | Status: DC

## 2015-06-02 MED ORDER — FLUTICASONE FUROATE-VILANTEROL 200-25 MCG/INH IN AEPB: 1.0000 | INHALATION_SPRAY | Freq: Every day | RESPIRATORY_TRACT | Status: DC

## 2015-06-02 MED ORDER — OLOPATADINE HCL 0.7 % OP SOLN: 1.0000 [drp] | OPHTHALMIC | Status: DC

## 2015-06-02 NOTE — Patient Instructions (Addendum)
  1. Allergen avoidance measures  2. Treat and prevent inflammation:   A. Breo 200 one inhalation 1 time per day (replaces Dulera)  B. Flonase 1-2 sprays each nostril one time per day  3. If needed:   A. OTC antihistamine - Zyrtec/Claritin/Allegra  B. nasal saline spray  C. Pazeo one drop each eye one time per day  D. Proventil HFA or ProAir HFA 2 puffs every 4-6 hours if needed  4. Immunotherapy?  5. Treatment for reflux? - Consolidate caffeine, chocolate, alcohol consumption  6. "Action plan" for asthma flare up:   A. add Flovent 220 - 2 inhalations twice a day to Breo  B. use Proventil HFA or ProAir HFA 2 puffs every 4-6 hours if needed  7. Return to clinic in 4 weeks or earlier if problem

## 2015-06-02 NOTE — Progress Notes (Signed)
Dear Dr. Larose Kells,  Thank you for referring April Little to the [facility] on 06/02/2015.   Below is a summation of this patient's evaluation and recommendations.  Thank you for your referral. I will keep you informed about this patient's response to treatment.   If you have any questions please to do hestitate to contact me.   Sincerely,  Jiles Prows, MD [place] [facility]   ______________________________________________________________________    NEW PATIENT NOTE  Referring Provider: Colon Branch, MD Primary Provider: Kathlene November, MD Date of office visit: 06/02/2015    Subjective:   Chief Complaint:  April Little (DOB: 12/21/1950) is a 65 y.o. female with a chief complaint of Asthma and Allergies  who presents to the clinic on 06/02/2015 with the following problems:  HPI: April Little presents this clinic in evaluation of asthma and allergies. She has a lifelong history of these problems that she thinks have become progressive the past several years. She has issues with nasal congestion and runny nose and nose blowing and occasional sneezing and itchy watery eyes without any obvious trigger that appear to occur on a perennial basis and may be somewhat worse during the spring and fall. She also has problems with wheezing and coughing and shortness of breath occurring on a perennial basis with flare usually during Christmas time and as well some flare during spring and fall. Recently she was at [facility] early April for 3 days and developed a flare of her coughing and wheezing associated with rhinitis but apparently didn't require any specific therapy to resolve this issue over a two-week period.  April Little's complaints of wheezing and coughing and shortness of breath did not appear to impact her life very much. She still very active and plays competitive tennis. She finds that when she initially starts  her tennis match she may have some coughing but that soon abates after she gets into her sport and she does not need to use any short acting bronchodilator. In fact, she rarely uses a short acting bronchodilator. It does not sound as though she's required any systemic steroids to treat her asthma in years.  April Little really does not want to use much medication. She questions whether her medications are actually necessary. She is confused about the medications that give her long-term benefit and short-term benefit and she is not sure if she should be using medications at all.  On one additional point, April Little does have common bouts of laryngitis whenever she develops respiratory tract flare up. She has very persistent throat clearing and the sensation of constant drainage down the back of her throat. She does not have any associated GI symptoms to suggest reflux. She drinks 4 cups of coffee and 4 diet Cokes per day.  Past Medical History  Diagnosis Date  . Diabetes type I ([place])     on a pump , f/u Dr Buddy Duty  . Asthma   . Hyperlipidemia   . Menopause     age 65    Past Surgical History  Procedure Laterality Date  . Cervical cone biopsy      in her 39s  . Knee arthroscopy  11-16-2011    L  . Cataract extraction  ~ 2016    R      Medication List           ADVIL PM 200-38 MG Tabs  Generic drug:  Ibuprofen-Diphenhydramine  Cit  Take 1 tablet by mouth at bedtime.     albuterol 108 (90 Base) MCG/ACT inhaler  Commonly known as:  PROAIR HFA  2 puffs every 4 hours as needed only  if your can't catch your breath     aspirin 81 MG tablet  Take 81 mg by mouth daily.     Biotin 5000 MCG Tabs  Take 1 tablet by mouth daily.     bisacodyl 5 MG EC tablet  Commonly known as:  DULCOLAX  Take 5 mg by mouth daily as needed for moderate constipation. Reported on 06/02/2015     dextromethorphan 30 MG/5ML liquid  Commonly known as:  DELSYM  Reported on 06/02/2015     fexofenadine 180 MG tablet    Commonly known as:  ALLEGRA  Take 180 mg by mouth at bedtime.     fluticasone 50 MCG/ACT nasal spray  Commonly known as:  FLONASE  Place 1 spray into both nostrils daily.     GLUCAGON EMERGENCY 1 MG injection  Generic drug:  glucagon  Inject 1 mg into the vein once as needed. Reported on 06/02/2015     HUMALOG 100 UNIT/ML injection  Generic drug:  insulin lispro  Continuous insulin pump     LANTUS 100 UNIT/ML injection  Generic drug:  insulin glargine  Inject 0.26 mLs (26 Units total) into the skin as directed. When unable to use insulin pump, per Dr. Buddy Duty     losartan 25 MG tablet  Commonly known as:  COZAAR  Take 1 tablet (25 mg total) by mouth 2 (two) times daily.     mometasone-formoterol 200-5 MCG/ACT Aero  Commonly known as:  DULERA  Take 2 puffs first thing in am and then another 2 puffs about 12 hours later.     simvastatin 10 MG tablet  Commonly known as:  ZOCOR  Take 1 tablet (10 mg total) by mouth at bedtime.     VITAMIN D-3 PO  Take 1 tablet by mouth daily.        No Known Allergies  Review of systems negative except as noted in HPI / PMHx or noted below:  Review of Systems  Constitutional: Negative.   HENT: Negative.   Eyes: Negative.   Respiratory: Negative.   Cardiovascular: Negative.   Gastrointestinal: Negative.   Genitourinary: Negative.   Musculoskeletal: Negative.   Skin: Negative.   Neurological: Negative.   Endo/Heme/Allergies: Negative.   Psychiatric/Behavioral: Negative.     Family History  Problem Relation Age of Onset  . Coronary artery disease Mother     M, B (MI age 7), B (age 44)  . Diabetes      several w/ type II  . Cirrhosis Father   . Colon cancer Neg Hx   . Prostate cancer Neg Hx   . Breast cancer Neg Hx   . Asthma Brother   . COPD Sister     smoked    Social History   Social History  . Marital Status: Married    Spouse Name: N/A  . Number of Children: 2  . Years of Education: N/A   Occupational History   . stay home mom    Social History Main Topics  . Smoking status: Former Smoker -- 3.00 packs/day for 12 years    Types: Cigarettes    Quit date: 02/15/1979  . Smokeless tobacco: Never Used  . Alcohol Use: 0.0 oz/week    0 Standard drinks or equivalent per week  Comment: occasional  . Drug Use: No  . Sexual Activity: Not on file   Other Topics Concern  . Not on file   Social History Narrative   Plays tennis ; has a disable son that depends on her     Environmental and Social history  Lives in a house with a dry environment, carpeting in the bedroom, no plastic on the bed but plastic on the pillows, a dog located inside the household, no smokers located inside the household.   Objective:   Filed Vitals:   06/02/15 1411  BP: 158/88  Pulse: 84  Temp: 97.6 F (36.4 C)  Resp: 18   Height: 5' 6.93" (170 cm) Weight: 163 lb 2.3 oz (74 kg)  Physical Exam  Constitutional: She is well-developed, well-nourished, and in no distress.  Throat clearing, raspy voice, slight cough  HENT:  Head: Normocephalic. Head is without right periorbital erythema and without left periorbital erythema.  Right Ear: Tympanic membrane, external ear and ear canal normal.  Left Ear: Tympanic membrane, external ear and ear canal normal.  Nose: Nose normal. No mucosal edema or rhinorrhea.  Mouth/Throat: Oropharynx is clear and moist and mucous membranes are normal. No oropharyngeal exudate.  Eyes: Conjunctivae and lids are normal. Pupils are equal, round, and reactive to light.  Neck: Trachea normal. No tracheal deviation present. No thyromegaly present.  Cardiovascular: Normal rate, regular rhythm, S1 normal, S2 normal and normal heart sounds.   No murmur heard. Pulmonary/Chest: Effort normal. No stridor. No tachypnea. No respiratory distress. She has wheezes (Scattered end expiratory wheezes that clear with cough). She has no rales. She exhibits no tenderness.  Abdominal: Soft. She exhibits no  distension and no mass. There is no hepatosplenomegaly. There is no tenderness. There is no rebound and no guarding.  Musculoskeletal: She exhibits no edema or tenderness.  Lymphadenopathy:       Head (right side): No tonsillar adenopathy present.       Head (left side): No tonsillar adenopathy present.    She has no cervical adenopathy.    She has no axillary adenopathy.  Neurological: She is alert. Gait normal.  Skin: No rash noted. She is not diaphoretic. No erythema. No pallor. Nails show no clubbing.  Psychiatric: Mood and affect normal.     Diagnostics: Allergy skin tests were performed. She demonstrated hypersensitivity against grasses and house dust mite.  Spirometry was performed and demonstrated an FEV1 of 1.83 @ 72 % of predicted. Following the administration of nebulized albuterol her FEV1 rose to 2.20 which was an increase in the FEV1 of 20%.  The patient had an Asthma Control Test with the following results:  .     Assessment and Plan:    1. Asthma, moderate persistent, well-controlled   2. Allergic rhinoconjunctivitis     1. Allergen avoidance measures  2. Treat and prevent inflammation:   A. Breo 200 one inhalation 1 time per day (replaces Dulera)  B. Flonase 1-2 sprays each nostril one time per day  3. If needed:   A. OTC antihistamine - Zyrtec/Claritin/Allegra  B. nasal saline spray  C. Pazeo one drop each eye one time per day  D. Proventil HFA or ProAir HFA 2 puffs every 4-6 hours if needed  4. Immunotherapy?  5. Treatment for reflux? - Consolidate caffeine, chocolate, alcohol consumption  6. "Action plan" for asthma flare up:   A. add Flovent 220 - 2 inhalations twice a day to Breo  B. use Proventil HFA or ProAir HFA  2 puffs every 4-6 hours if needed  7. Return to clinic in 4 weeks or earlier if problem  I will have Chandi perform allergen avoidance measures as best as possible and consistently using anti-inflammatory medications as specified  above. I think that some of her throat clearing and raspy voice and postnasal drip may actually be secondary to a reflux trigger and I did have a talk with her today about this issue and we'll start off by trying some behavioral modification to address this issue. She would be a candidate for immunotherapy if she continues to have problems in the face of the treatment mentioned above. To make it is convenient as possible I will try to give her medications that she can use only one time per day and I've given her an action plan to initiate should she develop a significant flareup of her asthma at some point in the future. I'll see her back in this clinic in 4 weeks or earlier if there is a problem.  Jiles Prows, MD [place]

## 2015-06-09 DIAGNOSIS — H40013 Open angle with borderline findings, low risk, bilateral: Secondary | ICD-10-CM | POA: Diagnosis not present

## 2015-06-09 DIAGNOSIS — E083211 Diabetes mellitus due to underlying condition with mild nonproliferative diabetic retinopathy with macular edema, right eye: Secondary | ICD-10-CM | POA: Diagnosis not present

## 2015-06-09 DIAGNOSIS — E103292 Type 1 diabetes mellitus with mild nonproliferative diabetic retinopathy without macular edema, left eye: Secondary | ICD-10-CM | POA: Diagnosis not present

## 2015-06-09 DIAGNOSIS — Z961 Presence of intraocular lens: Secondary | ICD-10-CM | POA: Diagnosis not present

## 2015-06-09 LAB — HM DIABETES EYE EXAM

## 2015-06-16 ENCOUNTER — Ambulatory Visit
Admission: RE | Admit: 2015-06-16 | Discharge: 2015-06-16 | Disposition: A | Discharge status: Home / Self Care | Payer: Medicare Other | Source: Ambulatory Visit | Attending: Internal Medicine | Admitting: Internal Medicine

## 2015-06-16 ENCOUNTER — Ambulatory Visit: Payer: Self-pay | Admitting: Allergy and Immunology

## 2015-06-16 DIAGNOSIS — Z780 Asymptomatic menopausal state: Secondary | ICD-10-CM | POA: Diagnosis not present

## 2015-06-16 DIAGNOSIS — M8589 Other specified disorders of bone density and structure, multiple sites: Secondary | ICD-10-CM | POA: Diagnosis not present

## 2015-06-22 ENCOUNTER — Encounter: Payer: Self-pay | Admitting: Internal Medicine

## 2015-06-29 DIAGNOSIS — H02834 Dermatochalasis of left upper eyelid: Secondary | ICD-10-CM | POA: Diagnosis not present

## 2015-06-29 DIAGNOSIS — H53483 Generalized contraction of visual field, bilateral: Secondary | ICD-10-CM | POA: Diagnosis not present

## 2015-06-29 DIAGNOSIS — H02831 Dermatochalasis of right upper eyelid: Secondary | ICD-10-CM | POA: Diagnosis not present

## 2015-06-29 DIAGNOSIS — H0279 Other degenerative disorders of eyelid and periocular area: Secondary | ICD-10-CM | POA: Diagnosis not present

## 2015-06-30 ENCOUNTER — Encounter: Payer: Self-pay | Admitting: Allergy and Immunology

## 2015-06-30 ENCOUNTER — Ambulatory Visit (INDEPENDENT_AMBULATORY_CARE_PROVIDER_SITE_OTHER): Payer: Medicare Other | Admitting: Allergy and Immunology

## 2015-06-30 VITALS — BP 130/80 | HR 76 | Resp 18

## 2015-06-30 DIAGNOSIS — J4540 Moderate persistent asthma, uncomplicated: Secondary | ICD-10-CM

## 2015-06-30 DIAGNOSIS — J309 Allergic rhinitis, unspecified: Secondary | ICD-10-CM

## 2015-06-30 DIAGNOSIS — H1010 Acute atopic conjunctivitis, unspecified eye: Secondary | ICD-10-CM | POA: Diagnosis not present

## 2015-06-30 DIAGNOSIS — J387 Other diseases of larynx: Secondary | ICD-10-CM | POA: Diagnosis not present

## 2015-06-30 DIAGNOSIS — K219 Gastro-esophageal reflux disease without esophagitis: Secondary | ICD-10-CM

## 2015-06-30 MED ORDER — FLUTICASONE-SALMETEROL 100-50 MCG/DOSE IN AEPB: 1.0000 | INHALATION_SPRAY | Freq: Two times a day (BID) | RESPIRATORY_TRACT | Status: DC

## 2015-06-30 NOTE — Patient Instructions (Addendum)
  1. Treat and prevent inflammation:   A. Advair 500 one inhalation one time per day (written two times per day)  B. Flonase 1-2 sprays each nostril one time per day  2. If needed:   A. OTC antihistamine - Zyrtec/Claritin/Allegra  B. nasal saline spray  C. Pazeo one drop each eye one time per day  D. Proventil HFA or ProAir HFA 2 puffs every 4-6 hours if needed  3. "Action plan" for asthma flare up:   A. add Flovent 220 - 2 inhalations twice a day to Advair two time per day  B. use Proventil HFA or ProAir HFA 2 puffs every 4-6 hours if needed  4. Return to clinic in 12 weeks or earlier if problem

## 2015-06-30 NOTE — Progress Notes (Signed)
Follow-up Note  Referring Provider: Colon Branch, MD Primary Provider: Kathlene November, MD Date of Office Visit: 06/30/2015  Subjective:   April Little (DOB: 12-Feb-1951) is a 65 y.o. female who returns to the [facility] [place] on 06/30/2015 in re-evaluation of the following:  HPI: April Little returns to this clinic in reevaluation of her asthma, allergic rhinoconjunctivitis, and suspected LPR. Since utilizing medical therapy established 06/02/2015 she feels much better. She is resolved all the issues with her nose and all the issues with her chest and she can exercise without any difficulty and her tennis players much better and her throat is improved although she still continues to have a somewhat raspy voice. She thinks that this raspy voice may be tied up with the use of her inhalers. Unfortunately, her insurance company will not pay for her Breo. She has eliminated all caffeine consumption. She no longer needs to use any antihistamines    Medication List           ADVIL PM 200-38 MG Tabs  Generic drug:  Ibuprofen-Diphenhydramine Cit  Take 1 tablet by mouth at bedtime.     albuterol 108 (90 Base) MCG/ACT inhaler  Commonly known as:  PROAIR HFA  2 puffs every 4 hours as needed only  if your can't catch your breath     aspirin 81 MG tablet  Take 81 mg by mouth daily.     Biotin 5000 MCG Tabs  Take 1 tablet by mouth daily.     bisacodyl 5 MG EC tablet  Commonly known as:  DULCOLAX  Take 5 mg by mouth daily as needed for moderate constipation. Reported on 06/02/2015     dextromethorphan 30 MG/5ML liquid  Commonly known as:  DELSYM  Reported on 06/02/2015     fexofenadine 180 MG tablet  Commonly known as:  ALLEGRA  Take 180 mg by mouth at bedtime.     fluticasone 50 MCG/ACT nasal spray  Commonly known as:  FLONASE  Place 1 spray into both nostrils daily.     fluticasone 50 MCG/ACT nasal spray  Commonly known as:  FLONASE  INHALE 2 PUFFS INTO THE LUNGS TWICE DAILY     fluticasone furoate-vilanterol 200-25 MCG/INH Aepb  Commonly known as:  BREO ELLIPTA  Inhale 1 puff into the lungs daily.     GLUCAGON EMERGENCY 1 MG injection  Generic drug:  glucagon  Inject 1 mg into the vein once as needed. Reported on 06/02/2015     HUMALOG 100 UNIT/ML injection  Generic drug:  insulin lispro  Continuous insulin pump     LANTUS 100 UNIT/ML injection  Generic drug:  insulin glargine  Inject 26 Units into the skin as directed. Reported on 06/02/2015     losartan 25 MG tablet  Commonly known as:  COZAAR  Take 1 tablet (25 mg total) by mouth 2 (two) times daily.     mometasone-formoterol 200-5 MCG/ACT Aero  Commonly known as:  DULERA  Take 2 puffs first thing in am and then another 2 puffs about 12 hours later.     Olopatadine HCl 0.7 % Soln  Commonly known as:  PAZEO  Place 1 drop into both eyes 1 day or 1 dose.     simvastatin 10 MG tablet  Commonly known as:  ZOCOR  Take 1 tablet (10 mg total) by mouth at bedtime.     VITAMIN D-3 PO  Take 1 tablet by mouth daily.  Past Medical History  Diagnosis Date  . Diabetes type I ([place])     on a pump , f/u Dr Buddy Duty  . Asthma   . Hyperlipidemia   . Menopause     age 66    Past Surgical History  Procedure Laterality Date  . Cervical cone biopsy      in her 59s  . Knee arthroscopy  11-16-2011    L  . Cataract extraction  ~ 2016    R    No Known Allergies  Review of systems negative except as noted in HPI / PMHx or noted below:  Review of Systems  Constitutional: Negative.   HENT: Negative.   Eyes: Negative.   Respiratory: Negative.   Cardiovascular: Negative.   Gastrointestinal: Negative.   Genitourinary: Negative.   Musculoskeletal: Negative.   Skin: Negative.   Neurological: Negative.   Endo/Heme/Allergies: Negative.   Psychiatric/Behavioral: Negative.      Objective:   Filed Vitals:   06/30/15 1550  BP: 130/80  Pulse: 76  Resp: 18          Physical Exam   Constitutional: She is well-developed, well-nourished, and in no distress.  HENT:  Head: Normocephalic.  Right Ear: Tympanic membrane, external ear and ear canal normal.  Left Ear: Tympanic membrane, external ear and ear canal normal.  Nose: Nose normal. No mucosal edema or rhinorrhea.  Mouth/Throat: Uvula is midline, oropharynx is clear and moist and mucous membranes are normal. No oropharyngeal exudate.  Eyes: Conjunctivae are normal.  Neck: Trachea normal. No tracheal tenderness present. No tracheal deviation present. No thyromegaly present.  Cardiovascular: Normal rate, regular rhythm, S1 normal, S2 normal and normal heart sounds.   No murmur heard. Pulmonary/Chest: Breath sounds normal. No stridor. No respiratory distress. She has no wheezes. She has no rales.  Musculoskeletal: She exhibits no edema.  Lymphadenopathy:       Head (right side): No tonsillar adenopathy present.       Head (left side): No tonsillar adenopathy present.    She has no cervical adenopathy.  Neurological: She is alert. Gait normal.  Skin: No rash noted. She is not diaphoretic. No erythema. Nails show no clubbing.  Psychiatric: Mood and affect normal.    Diagnostics:    Spirometry was performed and demonstrated an FEV1 of 3.61 at 137 % of predicted. Her previous FEV1 was 1.83  The patient had an Asthma Control Test with the following results: ACT Total Score: 23.    Assessment and Plan:   1. Asthma, moderate persistent, well-controlled   2. Allergic rhinoconjunctivitis   3. LPRD (laryngopharyngeal reflux disease)      1. Treat and prevent inflammation:   A. Advair 500 one inhalation one time per day (written two times per day)  B. Flonase 1-2 sprays each nostril one time per day  2. If needed:   A. OTC antihistamine - Zyrtec/Claritin/Allegra  B. nasal saline spray  C. Pazeo one drop each eye one time per day  D. Proventil HFA or ProAir HFA 2 puffs every 4-6 hours if needed  3. "Action  plan" for asthma flare up:   A. add Flovent 220 - 2 inhalations twice a day to Advair two time per day  B. use Proventil HFA or ProAir HFA 2 puffs every 4-6 hours if needed  4. Return to clinic in 12 weeks or earlier if problem  April Little is much better regarding her atopic respiratory disease and I would like to keep her on an inhaled  steroid and long-acting bronchodilator combination one time per day and we'll start by using Advair 500 to replace her Memory Dance as her insurance company will no longer cover this medication. She'll continue to use Flonase on a consistent basis as well. She will remain away from all caffeine consumption. I'll see her back in this clinic in approximately 12 weeks or earlier if there is a problem.  Allena Katz, MD [place]

## 2015-07-06 DIAGNOSIS — H53483 Generalized contraction of visual field, bilateral: Secondary | ICD-10-CM | POA: Diagnosis not present

## 2015-07-07 DIAGNOSIS — H43811 Vitreous degeneration, right eye: Secondary | ICD-10-CM | POA: Diagnosis not present

## 2015-07-07 DIAGNOSIS — E119 Type 2 diabetes mellitus without complications: Secondary | ICD-10-CM | POA: Diagnosis not present

## 2015-07-07 DIAGNOSIS — H33323 Round hole, bilateral: Secondary | ICD-10-CM | POA: Diagnosis not present

## 2015-07-07 DIAGNOSIS — H3561 Retinal hemorrhage, right eye: Secondary | ICD-10-CM | POA: Diagnosis not present

## 2015-07-09 DIAGNOSIS — H3561 Retinal hemorrhage, right eye: Secondary | ICD-10-CM | POA: Diagnosis not present

## 2015-07-09 DIAGNOSIS — E113493 Type 2 diabetes mellitus with severe nonproliferative diabetic retinopathy without macular edema, bilateral: Secondary | ICD-10-CM | POA: Diagnosis not present

## 2015-08-10 DIAGNOSIS — E1065 Type 1 diabetes mellitus with hyperglycemia: Secondary | ICD-10-CM | POA: Diagnosis not present

## 2015-08-10 DIAGNOSIS — Z794 Long term (current) use of insulin: Secondary | ICD-10-CM | POA: Diagnosis not present

## 2015-08-10 DIAGNOSIS — E1042 Type 1 diabetes mellitus with diabetic polyneuropathy: Secondary | ICD-10-CM | POA: Diagnosis not present

## 2015-08-10 LAB — HEMOGLOBIN A1C: Hemoglobin A1C: 7.7

## 2015-08-19 ENCOUNTER — Encounter: Payer: Self-pay | Admitting: Internal Medicine

## 2015-09-10 DIAGNOSIS — H02834 Dermatochalasis of left upper eyelid: Secondary | ICD-10-CM | POA: Diagnosis not present

## 2015-09-10 DIAGNOSIS — H02831 Dermatochalasis of right upper eyelid: Secondary | ICD-10-CM | POA: Diagnosis not present

## 2015-09-22 ENCOUNTER — Ambulatory Visit: Payer: Medicare Other | Admitting: Allergy and Immunology

## 2015-10-06 ENCOUNTER — Other Ambulatory Visit: Payer: Self-pay | Admitting: Internal Medicine

## 2015-10-26 ENCOUNTER — Other Ambulatory Visit: Payer: Self-pay | Admitting: Internal Medicine

## 2015-11-13 DIAGNOSIS — E1065 Type 1 diabetes mellitus with hyperglycemia: Secondary | ICD-10-CM | POA: Diagnosis not present

## 2015-11-13 DIAGNOSIS — Z23 Encounter for immunization: Secondary | ICD-10-CM | POA: Diagnosis not present

## 2015-11-13 DIAGNOSIS — E1042 Type 1 diabetes mellitus with diabetic polyneuropathy: Secondary | ICD-10-CM | POA: Diagnosis not present

## 2015-11-13 DIAGNOSIS — Z794 Long term (current) use of insulin: Secondary | ICD-10-CM | POA: Diagnosis not present

## 2015-11-17 ENCOUNTER — Encounter: Payer: Self-pay | Admitting: Internal Medicine

## 2015-12-14 DIAGNOSIS — H0279 Other degenerative disorders of eyelid and periocular area: Secondary | ICD-10-CM | POA: Diagnosis not present

## 2015-12-14 DIAGNOSIS — H02834 Dermatochalasis of left upper eyelid: Secondary | ICD-10-CM | POA: Diagnosis not present

## 2015-12-14 DIAGNOSIS — H02831 Dermatochalasis of right upper eyelid: Secondary | ICD-10-CM | POA: Diagnosis not present

## 2015-12-14 DIAGNOSIS — R234 Changes in skin texture: Secondary | ICD-10-CM | POA: Diagnosis not present

## 2015-12-29 ENCOUNTER — Encounter: Payer: Self-pay | Admitting: Allergy and Immunology

## 2015-12-29 ENCOUNTER — Ambulatory Visit (INDEPENDENT_AMBULATORY_CARE_PROVIDER_SITE_OTHER): Payer: Medicare Other | Admitting: Allergy and Immunology

## 2015-12-29 VITALS — BP 128/78 | HR 72 | Resp 16

## 2015-12-29 DIAGNOSIS — J3089 Other allergic rhinitis: Secondary | ICD-10-CM

## 2015-12-29 DIAGNOSIS — J4541 Moderate persistent asthma with (acute) exacerbation: Secondary | ICD-10-CM

## 2015-12-29 DIAGNOSIS — K219 Gastro-esophageal reflux disease without esophagitis: Secondary | ICD-10-CM | POA: Diagnosis not present

## 2015-12-29 MED ORDER — METHYLPREDNISOLONE ACETATE 80 MG/ML IJ SUSP
80.0000 mg | Freq: Once | INTRAMUSCULAR | Status: AC
  Administered 2015-12-29: 80 mg via INTRAMUSCULAR

## 2015-12-29 MED ORDER — FLUTICASONE PROPIONATE HFA 220 MCG/ACT IN AERO: 2.0000 | INHALATION_SPRAY | Freq: Two times a day (BID) | RESPIRATORY_TRACT | 5 refills | Status: DC

## 2015-12-29 NOTE — Progress Notes (Signed)
Follow-up Note  Referring Provider: Colon Branch, MD Primary Provider: Kathlene November, MD Date of Office Visit: 12/29/2015  Subjective:   April Little (DOB: [birth year]) is a 65 y.o. female who returns to the [facility] [place] on 12/29/2015 in re-evaluation of the following:  HPI: April Little presents to this clinic in evaluation of respiratory tract symptoms that started over the course of the past 10 days or so.  I had seen Samijo in this clinic during her initial evaluation of April 2017 at which point in time she appeared to have a component of atopic respiratory disease in the form of asthma and allergic rhinitis and a component of reflux-induced respiratory disease. We established a plan where she would use controller agents for both her upper and lower airways and consolidate her caffeine and chocolate and alcohol consumption. She did wonderful since that visit with very little activity involving her nose or chest and rarely uses a short acting bronchodilator and almost complete elimination of all reflux issues and her throat clearing and constant drainage in her throat with this plan.  Unfortunately, last Monday she develop problems with sore throat and then developed significant fatigue and ended up in bed for several days along with nasal congestion and initially blowing ugly nasal discharge and coughing and sputum production. Over the course of the past several days she's had some improvement regarding both her head and chest. She never had any high fever. The staff emanating from her nose has basically dried up. She was using her short-acting bronchodilator. It should be noted that her husband had a upper respiratory tract infection around the same point in time.    Medication List      aspirin 81 MG tablet Take 81 mg by mouth daily.   Biotin 5000 MCG Tabs Take 1 tablet by mouth daily.   bisacodyl 5 MG EC tablet Commonly known as:  DULCOLAX Take 5 mg by mouth daily as  needed for moderate constipation. Reported on 06/02/2015   dextromethorphan 30 MG/5ML liquid Commonly known as:  DELSYM Reported on 06/30/2015   fluticasone furoate-vilanterol 200-25 MCG/INH Aepb Commonly known as:  BREO ELLIPTA Inhale 1 puff into the lungs daily.   GLUCAGON EMERGENCY 1 MG injection Generic drug:  glucagon Inject 1 mg into the vein once as needed. Reported on 06/02/2015   HUMALOG 100 UNIT/ML injection Generic drug:  insulin lispro Continuous insulin pump   losartan 25 MG tablet Commonly known as:  COZAAR Take 1 tablet (25 mg total) by mouth 2 (two) times daily.   mometasone-formoterol 200-5 MCG/ACT Aero Commonly known as:  DULERA Inhale 2 puffs into the lungs 2 (two) times daily.   Olopatadine HCl 0.7 % Soln Commonly known as:  PAZEO Place 1 drop into both eyes 1 day or 1 dose.   simvastatin 10 MG tablet Commonly known as:  ZOCOR Take 1 tablet (10 mg total) by mouth at bedtime.   VENTOLIN HFA 108 (90 Base) MCG/ACT inhaler Generic drug:  albuterol Inhale 2 puffs into the lungs every 4 (four) hours as needed for wheezing or shortness of breath.   VITAMIN D-3 PO Take 1 tablet by mouth daily.       Past Medical History:  Diagnosis Date  . Asthma   . Diabetes type I ([place])    on a pump , f/u Dr Buddy Duty  . Hyperlipidemia   . Menopause    age 26    Past Surgical History:  Procedure Laterality  Date  . CATARACT EXTRACTION  ~ 2016   R  . CERVICAL CONE BIOPSY     in her 38s  . KNEE ARTHROSCOPY  11-16-2011   L    No Known Allergies  Review of systems negative except as noted in HPI / PMHx or noted below:  Review of Systems  Constitutional: Negative.   HENT: Negative.   Eyes: Negative.   Respiratory: Negative.   Cardiovascular: Negative.   Gastrointestinal: Negative.   Genitourinary: Negative.   Musculoskeletal: Negative.   Skin: Negative.   Neurological: Negative.   Endo/Heme/Allergies: Negative.   Psychiatric/Behavioral: Negative.       Objective:   Vitals:   12/29/15 1106  BP: 128/78  Pulse: 72  Resp: 16          Physical Exam  Constitutional: She is well-developed, well-nourished, and in no distress.  Coughing  HENT:  Head: Normocephalic.  Right Ear: Tympanic membrane, external ear and ear canal normal.  Left Ear: Tympanic membrane, external ear and ear canal normal.  Nose: Mucosal edema (erythematous) present. No rhinorrhea.  Mouth/Throat: Uvula is midline, oropharynx is clear and moist and mucous membranes are normal. No oropharyngeal exudate.  Eyes: Conjunctivae are normal.  Neck: Trachea normal. No tracheal tenderness present. No tracheal deviation present. No thyromegaly present.  Cardiovascular: Normal rate, regular rhythm, S1 normal, S2 normal and normal heart sounds.   No murmur heard. Pulmonary/Chest: No stridor. No respiratory distress. She has wheezes (expiratory wheezing posterior lung fields). She has no rales.  Musculoskeletal: She exhibits no edema.  Lymphadenopathy:       Head (right side): No tonsillar adenopathy present.       Head (left side): No tonsillar adenopathy present.    She has no cervical adenopathy.  Neurological: She is alert. Gait normal.  Skin: No rash noted. She is not diaphoretic. No erythema. Nails show no clubbing.  Psychiatric: Mood and affect normal.    Diagnostics:    Spirometry was performed and demonstrated an FEV1 of 1.53 at 62 % of predicted.  The patient had an Asthma Control Test with the following results:  .    Assessment and Plan:   1. Asthma, not well controlled, moderate persistent, with acute exacerbation   2. Other allergic rhinitis   3. LPRD (laryngopharyngeal reflux disease)     1. Treat and prevent inflammation:   A. Dulera 200 - two inhalations one time per day  B. Flonase 2 sprays each nostril one time per day  2. If needed:   A. OTC antihistamine - Zyrtec  B. nasal saline spray  C. Pazeo one drop each eye one time per  day  D. Proventil HFA or ProAir HFA 2 puffs every 4-6 hours if needed  3. "Action plan" for asthma flare up:   A. Increase Dulera to 2 inhalations two times per day  B. add Flovent 220 - 2 inhalations twice a day to Advair two time per day  B. use Proventil HFA or ProAir HFA 2 puffs every 4-6 hours if needed  4. For this most recent flare up from Husband's cold:   A. nebulized DuoNeb delivered in clinic  B. Depo-Medrol 80 IM delivered in clinic (check blood sugars)  5. Continue to consolidate caffeine, chocolate, alcohol  6. Return to clinic in  6 months or earlier if problemem  Aidyn obviously has a significant flare of her respiratory tract disease most likely secondary to the viral infection that she contracted from her husband recently. I  will treat her with anti-inflammatory agents as specified above including activation of her action plan and I will assume she'll do well. She will keep in contact with me noting her response over the course the next several days to weeks and if she does do well I'll see her back in this clinic in 6 months or earlier if there is a problem.  Allena Katz, MD [place]

## 2015-12-29 NOTE — Patient Instructions (Addendum)
  1. Treat and prevent inflammation:   A. Dulera 200 - two inhalations one time per day  B. Flonase 2 sprays each nostril one time per day  2. If needed:   A. OTC antihistamine - Zyrtec  B. nasal saline spray  C. Pazeo one drop each eye one time per day  D. Proventil HFA or ProAir HFA 2 puffs every 4-6 hours if needed  3. "Action plan" for asthma flare up:   A. Increase Dulera to 2 inhalations two times per day  B. add Flovent 220 - 2 inhalations twice a day to Advair two time per day  B. use Proventil HFA or ProAir HFA 2 puffs every 4-6 hours if needed  4. For this most recent flare up from Husband's cold:   A. nebulized DuoNeb delivered in clinic  B. Depo-Medrol 80 IM delivered in clinic (check blood sugars)  5. Continue to consolidate caffeine, chocolate, alcohol  6. Return to clinic in  6 months or earlier if problemem

## 2016-04-22 ENCOUNTER — Telehealth: Payer: Self-pay | Admitting: Internal Medicine

## 2016-04-22 NOTE — Telephone Encounter (Signed)
Called patient regarding awv. Left msg for patient to call office to schedule appt.

## 2016-04-25 ENCOUNTER — Telehealth: Payer: Self-pay | Admitting: *Deleted

## 2016-04-25 NOTE — Telephone Encounter (Signed)
Called patient and left message to return call

## 2016-04-26 ENCOUNTER — Ambulatory Visit: Payer: Medicare Other | Admitting: *Deleted

## 2016-05-16 NOTE — Progress Notes (Addendum)
Subjective:   April Little is a 66 y.o. female who presents for Medicare Annual (Subsequent) preventive examination.  Review of Systems:  No ROS.  Medicare Wellness Visit. Cardiac Risk Factors include: diabetes mellitus;advanced age (>68men, >14 women);dyslipidemia;hypertension Sleep patterns:  Takes advil pm. Sleeps 7-8 hrs per night. Feels rested. Wakes 1-2 x to urinate.  Home Safety/Smoke Alarms:  Feels safe in home. Smoke alarms in place.  Living environment; residence and Adult nurse: lives with husband in 2 story home. Seat Belt Safety/Bike Helmet: Wears seat belt.   Counseling:   Eye Exam- Wears reading glasses. Dr Herbert Deaner annually.  Dental- Dr.Minor annually.  Female:   Pap- yes can't recall doctor's name at this time.       Mammo- Last 05/16/12: BI-RADS CATEGORY 1:  Negative. Ordered today      Dexa scan- Last 06/16/15: osteopenia.        CCS- Last 05/14/12: Normal. Repeat in 10 yrs per report.    Objective:     Vitals: BP 130/68 (BP Location: Left Arm, Patient Position: Sitting, Cuff Size: Normal)   Pulse 75   Ht 5\' 7"  (1.702 m)   Wt 173 lb 1.6 oz (78.5 kg)   SpO2 98%   BMI 27.11 kg/m   Body mass index is 27.11 kg/m.   Tobacco History  Smoking Status  . Former Smoker  . Packs/day: 3.00  . Years: 12.00  . Types: Cigarettes  . Quit date: 02/15/1979  Smokeless Tobacco  . Never Used     Counseling given: Not Answered   Past Medical History:  Diagnosis Date  . Asthma   . Diabetes type I ([place])    on a pump , f/u Dr Buddy Duty  . Hyperlipidemia   . Menopause    age 74   Past Surgical History:  Procedure Laterality Date  . BLEPHAROPLASTY    . CATARACT EXTRACTION  ~ 2016   R  . CERVICAL CONE BIOPSY     in her 70s  . KNEE ARTHROSCOPY  11-16-2011   L   Family History  Problem Relation Age of Onset  . Coronary artery disease Mother     M, B (MI age 64), B (age 22)  . Diabetes      several w/ type II  . Cirrhosis Father   . Asthma Brother   . COPD  Sister     smoked  . Colon cancer Neg Hx   . Prostate cancer Neg Hx   . Breast cancer Neg Hx    History  Sexual Activity  . Sexual activity: No    Outpatient Encounter Prescriptions as of 05/17/2016  Medication Sig  . aspirin 81 MG tablet Take 81 mg by mouth daily.    . Biotin 5000 MCG TABS Take 1 tablet by mouth daily.  . bisacodyl (DULCOLAX) 5 MG EC tablet Take 5 mg by mouth daily as needed for moderate constipation. Reported on 06/02/2015  . fluticasone (FLOVENT HFA) 220 MCG/ACT inhaler Inhale 2 puffs into the lungs 2 (two) times daily. AS DIRECTED FOR ASTHMA FLAREUP  . HUMALOG 100 UNIT/ML injection Continuous insulin pump  . losartan (COZAAR) 25 MG tablet Take 1 tablet (25 mg total) by mouth 2 (two) times daily. (Patient taking differently: Take 50 mg by mouth 2 (two) times daily. )  . mometasone-formoterol (DULERA) 200-5 MCG/ACT AERO Inhale 2 puffs into the lungs 2 (two) times daily.  . simvastatin (ZOCOR) 10 MG tablet Take 1 tablet (10 mg total) by mouth  at bedtime.  . [DISCONTINUED] Cholecalciferol (VITAMIN D-3 PO) Take 1 tablet by mouth daily.  Marland Kitchen albuterol (VENTOLIN HFA) 108 (90 Base) MCG/ACT inhaler Inhale 2 puffs into the lungs every 4 (four) hours as needed for wheezing or shortness of breath.  Marland Kitchen glucagon (GLUCAGON EMERGENCY) 1 MG injection Inject 1 mg into the vein once as needed. Reported on 06/02/2015  . [DISCONTINUED] dextromethorphan (DELSYM) 30 MG/5ML liquid Reported on 06/30/2015  . [DISCONTINUED] Olopatadine HCl (PAZEO) 0.7 % SOLN Place 1 drop into both eyes 1 day or 1 dose. (Patient not taking: Reported on 12/29/2015)   No facility-administered encounter medications on file as of 05/17/2016.     Activities of Daily Living In your present state of health, do you have any difficulty performing the following activities: 05/17/2016  Hearing? N  Vision? N  Difficulty concentrating or making decisions? N  Walking or climbing stairs? N  Dressing or bathing? N  Doing errands,  shopping? N  Preparing Food and eating ? N  Using the Toilet? N  In the past six months, have you accidently leaked urine? N  Do you have problems with loss of bowel control? N  Managing your Medications? N  Managing your Finances? N  Housekeeping or managing your Housekeeping? N  Some recent data might be hidden    Patient Care Team: Colon Branch, MD as PCP - General (Internal Medicine) Elsie Stain, MD (Pulmonary Disease) Delrae Rend, MD as Consulting Physician (Endocrinology)    Assessment:    Physical assessment deferred to PCP.  Exercise Activities and Dietary recommendations Current Exercise Habits: Home exercise routine, Time (Minutes): > 60, Frequency (Times/Week): 5, Weekly Exercise (Minutes/Week): 0, Intensity: Intense   Diet (meal preparation, eat out, water intake, caffeinated beverages, dairy products, fruits and vegetables): in general, a "healthy" diet  , well balanced   Goals      Patient Stated   . <enter goal here> (pt-stated)          Maintain current healthy/active lifestyle.      Fall Risk Fall Risk  05/17/2016 05/04/2015 10/05/2014  Falls in the past year? No No No   Depression Screen PHQ 2/9 Scores 05/17/2016 05/04/2015 10/05/2014  PHQ - 2 Score 0 0 0     Cognitive Function        Immunization History  Administered Date(s) Administered  . Influenza Split 12/07/2010  . Influenza Whole 12/15/2008  . Influenza-Unspecified 11/13/2015  . Pneumococcal Conjugate-13 04/25/2013  . Pneumococcal Polysaccharide-23 10/25/2007, 12/15/2008  . Td 05/03/2006  . Zoster 04/25/2013   Screening Tests Health Maintenance  Topic Date Due  . Hepatitis C Screening  1950/11/27  . MAMMOGRAM  05/16/2013  . PNA vac Low Risk Adult (2 of 2 - PPSV23) 04/03/2015  . HEMOGLOBIN A1C  02/09/2016  . TETANUS/TDAP  05/02/2016  . FOOT EXAM  04/27/2016  . OPHTHALMOLOGY EXAM  06/08/2016  . INFLUENZA VACCINE  09/14/2016  . COLONOSCOPY  05/15/2022  . DEXA SCAN  Completed       Plan:     Follow up with Dr.Paz as schedule.  Schedule mammogram--ordered today.  Continue to eat heart healthy diet (full of fruits, vegetables, whole grains, lean protein, water--limit salt, fat, and sugar intake) and increase physical activity as tolerated.  Continue doing brain stimulating activities (puzzles, reading, adult coloring books, staying active) to keep memory sharp.   During the course of the visit the patient was educated and counseled about the following appropriate screening and preventive services:  Vaccines to include Pneumoccal, Influenza, Td, HCV  Cardiovascular Disease  Colorectal cancer screening  Bone density screening  Diabetes screening  Glaucoma screening  Mammography/PAP  Nutrition counseling   Patient Instructions (the written plan) was given to the patient.   Naaman Plummer [place], [place]  05/17/2016  Kathlene November, MD

## 2016-05-16 NOTE — Progress Notes (Signed)
Pre visit review using our clinic review tool, if applicable. No additional management support is needed unless otherwise documented below in the visit note. 

## 2016-05-17 ENCOUNTER — Ambulatory Visit (INDEPENDENT_AMBULATORY_CARE_PROVIDER_SITE_OTHER): Payer: Medicare Other | Admitting: *Deleted

## 2016-05-17 ENCOUNTER — Encounter: Payer: Self-pay | Admitting: *Deleted

## 2016-05-17 VITALS — BP 130/68 | HR 75 | Ht 67.0 in | Wt 173.1 lb

## 2016-05-17 DIAGNOSIS — Z1231 Encounter for screening mammogram for malignant neoplasm of breast: Secondary | ICD-10-CM

## 2016-05-17 DIAGNOSIS — Z0000 Encounter for general adult medical examination without abnormal findings: Secondary | ICD-10-CM

## 2016-05-17 DIAGNOSIS — Z1239 Encounter for other screening for malignant neoplasm of breast: Secondary | ICD-10-CM

## 2016-05-17 NOTE — Patient Instructions (Signed)
Follow up with Dr.Paz as schedule.  Schedule mammogram--ordered today.  Continue to eat heart healthy diet (full of fruits, vegetables, whole grains, lean protein, water--limit salt, fat, and sugar intake) and increase physical activity as tolerated.  Continue doing brain stimulating activities (puzzles, reading, adult coloring books, staying active) to keep memory sharp.

## 2016-05-25 ENCOUNTER — Encounter: Payer: Self-pay | Admitting: Internal Medicine

## 2016-05-25 ENCOUNTER — Ambulatory Visit (INDEPENDENT_AMBULATORY_CARE_PROVIDER_SITE_OTHER): Payer: Medicare Other | Admitting: Internal Medicine

## 2016-05-25 ENCOUNTER — Ambulatory Visit (HOSPITAL_BASED_OUTPATIENT_CLINIC_OR_DEPARTMENT_OTHER)
Admission: RE | Admit: 2016-05-25 | Discharge: 2016-05-25 | Disposition: A | Discharge status: Home / Self Care | Payer: Medicare Other | Source: Ambulatory Visit | Attending: Internal Medicine | Admitting: Internal Medicine

## 2016-05-25 ENCOUNTER — Telehealth: Payer: Self-pay

## 2016-05-25 VITALS — BP 122/78 | HR 64 | Temp 98.0°F | Resp 14 | Ht 67.0 in | Wt 168.1 lb

## 2016-05-25 DIAGNOSIS — M79644 Pain in right finger(s): Secondary | ICD-10-CM | POA: Diagnosis not present

## 2016-05-25 DIAGNOSIS — I10 Essential (primary) hypertension: Secondary | ICD-10-CM | POA: Diagnosis not present

## 2016-05-25 DIAGNOSIS — M19041 Primary osteoarthritis, right hand: Secondary | ICD-10-CM | POA: Diagnosis not present

## 2016-05-25 DIAGNOSIS — Z1159 Encounter for screening for other viral diseases: Secondary | ICD-10-CM

## 2016-05-25 DIAGNOSIS — E785 Hyperlipidemia, unspecified: Secondary | ICD-10-CM | POA: Diagnosis not present

## 2016-05-25 DIAGNOSIS — E109 Type 1 diabetes mellitus without complications: Secondary | ICD-10-CM

## 2016-05-25 LAB — CBC WITH DIFFERENTIAL/PLATELET
BASOS PCT: 2.1 % (ref 0.0–3.0)
Basophils Absolute: 0.1 10*3/uL (ref 0.0–0.1)
EOS ABS: 0.6 10*3/uL (ref 0.0–0.7)
Eosinophils Relative: 10.1 % — ABNORMAL HIGH (ref 0.0–5.0)
HCT: 40.5 % (ref 36.0–46.0)
Hemoglobin: 13.1 g/dL (ref 12.0–15.0)
Lymphocytes Relative: 22.3 % (ref 12.0–46.0)
Lymphs Abs: 1.3 10*3/uL (ref 0.7–4.0)
MCHC: 32.3 g/dL (ref 30.0–36.0)
MCV: 86.1 fl (ref 78.0–100.0)
Monocytes Absolute: 0.6 10*3/uL (ref 0.1–1.0)
Monocytes Relative: 10 % (ref 3.0–12.0)
NEUTROS ABS: 3.2 10*3/uL (ref 1.4–7.7)
Neutrophils Relative %: 55.5 % (ref 43.0–77.0)
PLATELETS: 258 10*3/uL (ref 150.0–400.0)
RBC: 4.7 Mil/uL (ref 3.87–5.11)
RDW: 14.3 % (ref 11.5–15.5)
WBC: 5.8 10*3/uL (ref 4.0–10.5)

## 2016-05-25 LAB — COMPREHENSIVE METABOLIC PANEL
ALBUMIN: 4.3 g/dL (ref 3.5–5.2)
ALT: 20 U/L (ref 0–35)
AST: 20 U/L (ref 0–37)
Alkaline Phosphatase: 68 U/L (ref 39–117)
BUN: 17 mg/dL (ref 6–23)
CALCIUM: 9.4 mg/dL (ref 8.4–10.5)
CHLORIDE: 102 meq/L (ref 96–112)
CO2: 28 mEq/L (ref 19–32)
CREATININE: 0.89 mg/dL (ref 0.40–1.20)
GFR: 67.42 mL/min (ref >60.00)
GLUCOSE: 137 mg/dL — AB (ref 70–99)
Potassium: 4.3 mEq/L (ref 3.5–5.1)
SODIUM: 138 meq/L (ref 135–145)
Total Bilirubin: 0.4 mg/dL (ref 0.2–1.2)
Total Protein: 7.1 g/dL (ref 6.0–8.3)

## 2016-05-25 LAB — URIC ACID: URIC ACID, SERUM: 3.9 mg/dL (ref 2.4–7.0)

## 2016-05-25 LAB — TSH: TSH: 3.21 u[IU]/mL (ref 0.35–4.50)

## 2016-05-25 MED ORDER — MOMETASONE FURO-FORMOTEROL FUM 200-5 MCG/ACT IN AERO: 2.0000 | INHALATION_SPRAY | Freq: Every day | RESPIRATORY_TRACT | 1 refills | Status: DC

## 2016-05-25 NOTE — Patient Instructions (Addendum)
GO TO THE LAB : Get the blood work     GO TO THE FRONT DESK Schedule your next appointment for a  checkup in one year    STOP BY THE FIRST FLOOR:  get the XR

## 2016-05-25 NOTE — Telephone Encounter (Signed)
PA initiated via Covermymeds; KEY: [id number]. Awaiting determination.

## 2016-05-25 NOTE — Telephone Encounter (Signed)
Received PA approval. Medication approved through 05/25/2017. PA notification sent for scanning.

## 2016-05-25 NOTE — Progress Notes (Signed)
Pre visit review using our clinic review tool, if applicable. No additional management support is needed unless otherwise documented below in the visit note. 

## 2016-05-25 NOTE — Progress Notes (Signed)
Subjective:    Patient ID: April Little, female    DOB: Nov 11, 1950, 66 y.o.   MRN: [id number]  DOS:  05/25/2016 Type of visit - description : Routine visit Interval history: DM: Good compliance with medications, has not seen endocrinology in about a year. High cholesterol: On simvastatin. Has a knot at the right thumb, was "inflamed" recently, was tender, red and somewhat warm. Now is back to "normal". Asthma: Unable to get [facility] Patter, using Dulera but she ran out. HTN: Good medication compliance, no ambulatory BPs  Review of Systems remains very active.   Past Medical History:  Diagnosis Date  . Asthma   . Diabetes type I ([place])    on a pump , f/u Dr Buddy Duty  . Hyperlipidemia   . Menopause    age 75    Past Surgical History:  Procedure Laterality Date  . BLEPHAROPLASTY    . CATARACT EXTRACTION  ~ 2016   R  . CERVICAL CONE BIOPSY     in her 13s  . KNEE ARTHROSCOPY  11-16-2011   L    Social History   Social History  . Marital status: Married    Spouse name: N/A  . Number of children: 2  . Years of education: N/A   Occupational History  . 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 Smoking status: Former Smoker    Packs/day: 3.00    Years: 12.00    Types: Cigarettes    Quit date: 02/15/1979  . Smokeless tobacco: Never Used  . Alcohol use 0.0 oz/week     Comment: occasional  . Drug use: No  . Sexual activity: No   Other Topics Concern  . Not on file   Social History Narrative   Plays tennis ; has a disable son that depends on her       Allergies as of 05/25/2016   No Known Allergies     Medication List       Accurate as of 05/25/16 11:59 PM. Always use your most recent med list.          aspirin 81 MG tablet Take 81 mg by mouth daily.   Biotin 5000 MCG Tabs Take 1 tablet by mouth daily.   bisacodyl 5 MG EC tablet Commonly known as:  DULCOLAX Take 5 mg by mouth daily as needed for moderate constipation. Reported on  06/02/2015   GLUCAGON EMERGENCY 1 MG injection Generic drug:  glucagon Inject 1 mg into the vein once as needed. Reported on 06/02/2015   HUMALOG 100 UNIT/ML injection Generic drug:  insulin lispro Continuous insulin pump   losartan 25 MG tablet Commonly known as:  COZAAR Take 1 tablet (25 mg total) by mouth 2 (two) times daily.   mometasone-formoterol 200-5 MCG/ACT Aero Commonly known as:  DULERA Inhale 2 puffs into the lungs daily.   simvastatin 10 MG tablet Commonly known as:  ZOCOR Take 1 tablet (10 mg total) by mouth at bedtime.   VENTOLIN HFA 108 (90 Base) MCG/ACT inhaler Generic drug:  albuterol Inhale 2 puffs into the lungs every 4 (four) hours as needed for wheezing or shortness of breath.          Objective:   Physical Exam BP 122/78 (BP Location: Left Arm, Patient Position: Sitting, Cuff Size: Normal)   Pulse 64   Temp 98 F (36.7 C) (Oral)   Resp 14   Ht 5\' 7"  (1.702 m)   Wt 168 lb 2  oz (76.3 kg)   SpO2 96%   BMI 26.33 kg/m  General:   Well developed, well nourished . NAD.  HEENT:  Normocephalic . Face symmetric, atraumatic Lungs:  Few rhonchi. Normal respiratory effort, no intercostal retractions, no accessory muscle use. Heart: RRR,  no murmur.  no pretibial edema bilaterally  Abdomen:  Not distended, soft, non-tender. No rebound or rigidity.  Skin: Not pale. Not jaundice MSK: Hands and wrists without synovitis, few deformities consistent with mild DJD. Right thumb, has a firm nodule by the PIP, mildly tender, no actual redness, warmness.  Neurologic:  alert & oriented X3.  Speech normal, gait appropriate for age and unassisted Psych--  Cognition and judgment appear intact.  Cooperative with normal attention span and concentration.  Behavior appropriate. No anxious or depressed appearing.    Assessment & Plan:   Assessment DM type I Dr. Buddy Duty HTN Hyperlipidemia Asthma Menopause , age 1 + FH CAD  PLAN DM: Due to see Dr. Buddy Duty,  currently using a insulin pump and a continuous glucose monitoring. HTN: On losartan, check a CMP, CBC . Asthma: Note from Dr. Carmelina Peal 12-2015 reviewed, sx not well controlled at that time. Currently doing okay. Was unable to get [facility], ran out of [facility] 2 weeks ago but she is doing well. Will refill Dulera, continue albuterol and will notify Dr. Carmelina Peal via message. High cholesterol: Not fasting, on simvastatin, last FLP satisfactory, check LFTs. Pain, right thumb: Likely DJD but last week the PIP was  red and swollen, will get a x-ray and uric acid. Hep C screening Osteopenia: per DEXA  06/2015, encouraged calcium and vitamin D. Recheck 2 years  Encourage to see her gynecologist for a Pap smear, declined a referral. MMG scheduled for tomorrow.  RTC one year

## 2016-05-26 ENCOUNTER — Ambulatory Visit (HOSPITAL_BASED_OUTPATIENT_CLINIC_OR_DEPARTMENT_OTHER)
Admission: RE | Admit: 2016-05-26 | Discharge: 2016-05-26 | Disposition: A | Discharge status: Home / Self Care | Payer: Medicare Other | Source: Ambulatory Visit | Attending: Internal Medicine | Admitting: Internal Medicine

## 2016-05-26 DIAGNOSIS — Z1231 Encounter for screening mammogram for malignant neoplasm of breast: Secondary | ICD-10-CM | POA: Insufficient documentation

## 2016-05-26 DIAGNOSIS — Z1239 Encounter for other screening for malignant neoplasm of breast: Secondary | ICD-10-CM

## 2016-05-26 LAB — HEPATITIS C ANTIBODY: HCV Ab: NEGATIVE

## 2016-05-27 NOTE — Assessment & Plan Note (Signed)
DM: Due to see Dr. Buddy Duty, currently using a insulin pump and a continuous glucose monitoring. HTN: On losartan, check a CMP, CBC . Asthma: Note from Dr. Carmelina Peal 12-2015 reviewed, sx not well controlled at that time. Currently doing okay. Was unable to get [facility], ran out of [facility] 2 weeks ago but she is doing well. Will refill Dulera, continue albuterol and will notify Dr. Carmelina Peal via message. High cholesterol: Not fasting, on simvastatin, last FLP satisfactory, check LFTs. Pain, right thumb: Likely DJD but last week the PIP was  red and swollen, will get a x-ray and uric acid. Hep C screening Osteopenia: per DEXA  06/2015, encouraged calcium and vitamin D. Recheck 2 years  Encourage to see her gynecologist for a Pap smear, declined a referral. MMG scheduled for tomorrow.  RTC one year

## 2016-06-28 ENCOUNTER — Ambulatory Visit (INDEPENDENT_AMBULATORY_CARE_PROVIDER_SITE_OTHER): Payer: Medicare Other | Admitting: Allergy and Immunology

## 2016-06-28 VITALS — BP 128/78 | HR 80 | Resp 18

## 2016-06-28 DIAGNOSIS — J4541 Moderate persistent asthma with (acute) exacerbation: Secondary | ICD-10-CM

## 2016-06-28 DIAGNOSIS — J3089 Other allergic rhinitis: Secondary | ICD-10-CM | POA: Diagnosis not present

## 2016-06-28 DIAGNOSIS — K219 Gastro-esophageal reflux disease without esophagitis: Secondary | ICD-10-CM | POA: Diagnosis not present

## 2016-06-28 MED ORDER — BUDESONIDE-FORMOTEROL FUMARATE 160-4.5 MCG/ACT IN AERO: INHALATION_SPRAY | RESPIRATORY_TRACT | 5 refills | Status: DC

## 2016-06-28 MED ORDER — OLOPATADINE HCL 0.7 % OP SOLN: 1.0000 [drp] | Freq: Every day | OPHTHALMIC | 5 refills | Status: DC | PRN

## 2016-06-28 NOTE — Progress Notes (Signed)
Follow-up Note   Referring Provider: Colon Branch, MD Primary Provider: Colon Branch, MD Date of Office Visit: 06/28/2016  Subjective:   April Little (DOB: [birth year]) is a 66 y.o. female who returns to the [facility] [place] on 06/28/2016 in re-evaluation of the following:  HPI: Leather returns to this clinic in reevaluation of her asthma with poor perception of dyspnea, allergic rhinitis, and possible component of LPR. I have not seen her in this clinic in the past 6 months.  Overall she has done relatively well without the need for an antibiotic or systemic steroid to treat any type of respiratory tract issue.  However, she had her insurance company discontinue her availability of Dulera over the course of the past several weeks and she has developed some wheezing and coughing. She did try to start her action plan which was Flovent but is was inadequate in alleviating her symptoms. Her upper airways are doing relatively well and there is no suggestion that she has sinusitis. She does not have any ugly nasal discharge or inability to smell or significant nasal congestion. She does continue on Flonase. She has had no issues with reflux. She still remains away from all caffeine consumption.  Allergies as of 06/28/2016   No Known Allergies     Medication List      aspirin 81 MG tablet Take 81 mg by mouth daily.   Biotin 5000 MCG Tabs Take 1 tablet by mouth daily.   bisacodyl 5 MG EC tablet Commonly known as:  DULCOLAX Take 5 mg by mouth daily as needed for moderate constipation. Reported on 06/02/2015   fexofenadine 180 MG tablet Commonly known as:  ALLEGRA Take 180 mg by mouth daily.   fluticasone 220 MCG/ACT inhaler Commonly known as:  FLOVENT HFA Inhale 2 puffs into the lungs 2 (two) times daily.   fluticasone 50 MCG/ACT nasal spray Commonly known as:  FLONASE Place 2 sprays into both nostrils daily.   GLUCAGON EMERGENCY 1 MG injection Generic drug:   glucagon Inject 1 mg into the vein once as needed. Reported on 06/02/2015   HUMALOG 100 UNIT/ML injection Generic drug:  insulin lispro Continuous insulin pump   losartan 25 MG tablet Commonly known as:  COZAAR Take 1 tablet (25 mg total) by mouth 2 (two) times daily.   MICROLET LANCETS Misc USE TO TEST BLOOD SUGAR SEVEN TIMES DAILY   simvastatin 10 MG tablet Commonly known as:  ZOCOR Take 1 tablet (10 mg total) by mouth at bedtime.   VENTOLIN HFA 108 (90 Base) MCG/ACT inhaler Generic drug:  albuterol Inhale 2 puffs into the lungs every 4 (four) hours as needed for wheezing or shortness of breath.       Past Medical History:  Diagnosis Date  . Asthma   . Diabetes type I ([place])    on a pump , f/u Dr Buddy Duty  . Hyperlipidemia   . Menopause    age 78    Past Surgical History:  Procedure Laterality Date  . BLEPHAROPLASTY    . CATARACT EXTRACTION  ~ 2016   R  . CERVICAL CONE BIOPSY     in her 81s  . KNEE ARTHROSCOPY  11-16-2011   L    Review of systems negative except as noted in HPI / PMHx or noted below:  Review of Systems  Constitutional: Negative.   HENT: Negative.   Eyes: Negative.   Respiratory: Negative.   Cardiovascular: Negative.   Gastrointestinal: Negative.  Genitourinary: Negative.   Musculoskeletal: Negative.   Skin: Negative.   Neurological: Negative.   Endo/Heme/Allergies: Negative.   Psychiatric/Behavioral: Negative.      Objective:   Vitals:   06/28/16 1600  BP: 128/78  Pulse: 80  Resp: 18          Physical Exam  Constitutional: She is well-developed, well-nourished, and in no distress.  HENT:  Head: Normocephalic.  Right Ear: Tympanic membrane, external ear and ear canal normal.  Left Ear: Tympanic membrane, external ear and ear canal normal.  Nose: Nose normal. No mucosal edema or rhinorrhea.  Mouth/Throat: Uvula is midline, oropharynx is clear and moist and mucous membranes are normal. No oropharyngeal exudate.  Eyes:  Conjunctivae are normal.  Neck: Trachea normal. No tracheal tenderness present. No tracheal deviation present. No thyromegaly present.  Cardiovascular: Normal rate, regular rhythm, S1 normal, S2 normal and normal heart sounds.   No murmur heard. Pulmonary/Chest: No stridor. No respiratory distress. She has wheezes (bilateral inspiratory and expiratory wheezes posterior lung fields). She has no rales.  Musculoskeletal: She exhibits no edema.  Lymphadenopathy:       Head (right side): No tonsillar adenopathy present.       Head (left side): No tonsillar adenopathy present.    She has no cervical adenopathy.  Neurological: She is alert. Gait normal.  Skin: No rash noted. She is not diaphoretic. No erythema. Nails show no clubbing.  Psychiatric: Mood and affect normal.    Diagnostics:    Spirometry was performed and demonstrated an FEV1 of 1.62 at 64 % of predicted.  Assessment and Plan:   1. Asthma, not well controlled, moderate persistent, with acute exacerbation   2. Other allergic rhinitis   3. LPRD (laryngopharyngeal reflux disease)     1. Treat and prevent inflammation:   A. Symbicort 160 - two inhalations two times per day  B. Flonase 2 sprays each nostril one time per day  2. If needed:   A. OTC antihistamine - Zyrtec  B. nasal saline spray  C. Pazeo one drop each eye one time per day  D. Proventil HFA or ProAir HFA 2 puffs every 4-6 hours if needed  3. "Action plan" for asthma flare up:   A. add Flovent 220 to Symbicort - 2 inhalations twice a day   B. use Proventil HFA or ProAir HFA 2 puffs every 4-6 hours if needed  4. Continue to consolidate caffeine, chocolate, alcohol  5. Return to clinic in  6 months or earlier if problem  I will restart Aishani on a combination inhaler with a ICS and long-acting bronchodilator and she will activate her action plan for whenever she develops significant respiratory tract symptoms. Otherwise, I will assume that she will do well  and I will see her back in this clinic in 6 months or earlier if there is a problem.  Allena Katz, MD Allergy / Immunology [place]

## 2016-06-28 NOTE — Patient Instructions (Addendum)
  1. Treat and prevent inflammation:   A. Symbicort 160 - two inhalations two times per day  B. Flonase 2 sprays each nostril one time per day  2. If needed:   A. OTC antihistamine - Zyrtec  B. nasal saline spray  C. Pazeo one drop each eye one time per day  D. Proventil HFA or ProAir HFA 2 puffs every 4-6 hours if needed  3. "Action plan" for asthma flare up:   A. add Flovent 220 to Symbicort - 2 inhalations twice a day   B. use Proventil HFA or ProAir HFA 2 puffs every 4-6 hours if needed  4. Continue to consolidate caffeine, chocolate, alcohol  5. Return to clinic in  6 months or earlier if problem

## 2016-06-29 ENCOUNTER — Encounter: Payer: Self-pay | Admitting: Allergy and Immunology

## 2016-07-05 DIAGNOSIS — H35033 Hypertensive retinopathy, bilateral: Secondary | ICD-10-CM | POA: Diagnosis not present

## 2016-07-05 DIAGNOSIS — H40013 Open angle with borderline findings, low risk, bilateral: Secondary | ICD-10-CM | POA: Diagnosis not present

## 2016-07-05 DIAGNOSIS — E083211 Diabetes mellitus due to underlying condition with mild nonproliferative diabetic retinopathy with macular edema, right eye: Secondary | ICD-10-CM | POA: Diagnosis not present

## 2016-07-05 DIAGNOSIS — H01003 Unspecified blepharitis right eye, unspecified eyelid: Secondary | ICD-10-CM | POA: Diagnosis not present

## 2016-07-05 LAB — HM DIABETES EYE EXAM

## 2016-07-07 ENCOUNTER — Encounter: Payer: Self-pay | Admitting: Internal Medicine

## 2016-07-13 ENCOUNTER — Other Ambulatory Visit: Payer: Self-pay | Admitting: Internal Medicine

## 2016-08-04 DIAGNOSIS — E1042 Type 1 diabetes mellitus with diabetic polyneuropathy: Secondary | ICD-10-CM | POA: Diagnosis not present

## 2016-08-04 DIAGNOSIS — E1065 Type 1 diabetes mellitus with hyperglycemia: Secondary | ICD-10-CM | POA: Diagnosis not present

## 2016-08-04 DIAGNOSIS — Z794 Long term (current) use of insulin: Secondary | ICD-10-CM | POA: Diagnosis not present

## 2016-08-04 LAB — MICROALBUMIN, URINE

## 2016-08-04 LAB — LIPID PANEL
CHOLESTEROL: 165 (ref 0–200)
HDL: 94 — AB (ref 35–70)
LDL CALC: 65
LDl/HDL Ratio: 1.8
TRIGLYCERIDES: 35 — AB (ref 40–160)

## 2016-08-04 LAB — HEMOGLOBIN A1C: HEMOGLOBIN A1C: 8.6

## 2016-08-04 LAB — HEPATIC FUNCTION PANEL
ALT: 20 (ref 7–35)
AST: 22 (ref 13–35)
Alkaline Phosphatase: 70 (ref 25–125)
Bilirubin, Total: 0.5

## 2016-08-04 LAB — BASIC METABOLIC PANEL
BUN: 13 (ref 4–21)
Creatinine: 0.8 (ref 0.5–1.1)
Glucose: 144
Potassium: 4.1 (ref 3.4–5.3)
Sodium: 141 (ref 137–147)

## 2016-08-04 LAB — TSH: TSH: 4.09 (ref 0.41–5.90)

## 2016-08-04 LAB — HM DIABETES FOOT EXAM

## 2016-08-19 ENCOUNTER — Encounter: Payer: Self-pay | Admitting: Internal Medicine

## 2016-11-21 DIAGNOSIS — Z794 Long term (current) use of insulin: Secondary | ICD-10-CM | POA: Diagnosis not present

## 2016-11-21 DIAGNOSIS — D692 Other nonthrombocytopenic purpura: Secondary | ICD-10-CM | POA: Diagnosis not present

## 2016-11-21 DIAGNOSIS — E1042 Type 1 diabetes mellitus with diabetic polyneuropathy: Secondary | ICD-10-CM | POA: Diagnosis not present

## 2016-11-21 DIAGNOSIS — E1065 Type 1 diabetes mellitus with hyperglycemia: Secondary | ICD-10-CM | POA: Diagnosis not present

## 2017-01-13 DIAGNOSIS — H01003 Unspecified blepharitis right eye, unspecified eyelid: Secondary | ICD-10-CM | POA: Diagnosis not present

## 2017-01-13 DIAGNOSIS — H04123 Dry eye syndrome of bilateral lacrimal glands: Secondary | ICD-10-CM | POA: Diagnosis not present

## 2017-03-17 DIAGNOSIS — Z794 Long term (current) use of insulin: Secondary | ICD-10-CM | POA: Diagnosis not present

## 2017-03-17 DIAGNOSIS — E1042 Type 1 diabetes mellitus with diabetic polyneuropathy: Secondary | ICD-10-CM | POA: Diagnosis not present

## 2017-03-17 DIAGNOSIS — E1065 Type 1 diabetes mellitus with hyperglycemia: Secondary | ICD-10-CM | POA: Diagnosis not present

## 2017-03-17 LAB — HEMOGLOBIN A1C: HEMOGLOBIN A1C: 8.6

## 2017-03-17 LAB — HM DIABETES FOOT EXAM

## 2017-03-22 ENCOUNTER — Encounter: Payer: Self-pay | Admitting: Internal Medicine

## 2017-06-06 DIAGNOSIS — L249 Irritant contact dermatitis, unspecified cause: Secondary | ICD-10-CM | POA: Diagnosis not present

## 2017-06-23 ENCOUNTER — Telehealth: Payer: Self-pay | Admitting: Allergy and Immunology

## 2017-06-23 MED ORDER — BUDESONIDE-FORMOTEROL FUMARATE 160-4.5 MCG/ACT IN AERO: INHALATION_SPRAY | RESPIRATORY_TRACT | 0 refills | Status: DC

## 2017-06-23 NOTE — Telephone Encounter (Signed)
Pt called and needs to have Dulera  Or advair called in that ins will cover and wants to know if there is a $15.00 coupon on dulera that she can get. [place]

## 2017-06-23 NOTE — Telephone Encounter (Signed)
Patient advised of medication issue. She was okay with being scheduled for OV and for Symbicort to be sent. I have done both.

## 2017-06-23 NOTE — Addendum Note (Signed)
Addended by: Lucrezia Starch I on: 06/23/2017 02:48 PM   Modules accepted: Orders

## 2017-06-23 NOTE — Telephone Encounter (Signed)
I left a message for the patient to call back and discuss. She was on the [facility] and the Adviar previously but is currently on Symbicort 160. She is also due for an OV.

## 2017-07-11 ENCOUNTER — Ambulatory Visit (INDEPENDENT_AMBULATORY_CARE_PROVIDER_SITE_OTHER): Payer: Medicare Other | Admitting: Internal Medicine

## 2017-07-11 ENCOUNTER — Encounter: Payer: Self-pay | Admitting: Internal Medicine

## 2017-07-11 VITALS — BP 126/66 | HR 70 | Temp 97.8°F | Resp 14 | Ht 67.0 in | Wt 171.0 lb

## 2017-07-11 DIAGNOSIS — E875 Hyperkalemia: Secondary | ICD-10-CM

## 2017-07-11 DIAGNOSIS — E113292 Type 2 diabetes mellitus with mild nonproliferative diabetic retinopathy without macular edema, left eye: Secondary | ICD-10-CM | POA: Diagnosis not present

## 2017-07-11 DIAGNOSIS — Z23 Encounter for immunization: Secondary | ICD-10-CM

## 2017-07-11 DIAGNOSIS — Z1272 Encounter for screening for malignant neoplasm of vagina: Secondary | ICD-10-CM

## 2017-07-11 DIAGNOSIS — H25012 Cortical age-related cataract, left eye: Secondary | ICD-10-CM | POA: Diagnosis not present

## 2017-07-11 DIAGNOSIS — E785 Hyperlipidemia, unspecified: Secondary | ICD-10-CM | POA: Diagnosis not present

## 2017-07-11 DIAGNOSIS — Z01419 Encounter for gynecological examination (general) (routine) without abnormal findings: Secondary | ICD-10-CM

## 2017-07-11 DIAGNOSIS — S60511A Abrasion of right hand, initial encounter: Secondary | ICD-10-CM | POA: Diagnosis not present

## 2017-07-11 DIAGNOSIS — Z780 Asymptomatic menopausal state: Secondary | ICD-10-CM

## 2017-07-11 DIAGNOSIS — I10 Essential (primary) hypertension: Secondary | ICD-10-CM | POA: Diagnosis not present

## 2017-07-11 DIAGNOSIS — M8580 Other specified disorders of bone density and structure, unspecified site: Secondary | ICD-10-CM

## 2017-07-11 DIAGNOSIS — E113211 Type 2 diabetes mellitus with mild nonproliferative diabetic retinopathy with macular edema, right eye: Secondary | ICD-10-CM | POA: Diagnosis not present

## 2017-07-11 DIAGNOSIS — H2512 Age-related nuclear cataract, left eye: Secondary | ICD-10-CM | POA: Diagnosis not present

## 2017-07-11 DIAGNOSIS — L989 Disorder of the skin and subcutaneous tissue, unspecified: Secondary | ICD-10-CM

## 2017-07-11 DIAGNOSIS — Z1231 Encounter for screening mammogram for malignant neoplasm of breast: Secondary | ICD-10-CM

## 2017-07-11 DIAGNOSIS — H40013 Open angle with borderline findings, low risk, bilateral: Secondary | ICD-10-CM | POA: Diagnosis not present

## 2017-07-11 LAB — COMPREHENSIVE METABOLIC PANEL
ALT: 23 U/L (ref 0–35)
AST: 23 U/L (ref 0–37)
Albumin: 4.1 g/dL (ref 3.5–5.2)
Alkaline Phosphatase: 66 U/L (ref 39–117)
BUN: 19 mg/dL (ref 6–23)
CALCIUM: 9.5 mg/dL (ref 8.4–10.5)
CO2: 31 meq/L (ref 19–32)
CREATININE: 0.86 mg/dL (ref 0.40–1.20)
Chloride: 105 mEq/L (ref 96–112)
GFR: 69.9 mL/min (ref >60.00)
Glucose, Bld: 77 mg/dL (ref 70–99)
POTASSIUM: 5.5 meq/L — AB (ref 3.5–5.1)
SODIUM: 141 meq/L (ref 135–145)
Total Bilirubin: 0.5 mg/dL (ref 0.2–1.2)
Total Protein: 6.6 g/dL (ref 6.0–8.3)

## 2017-07-11 LAB — HM DIABETES EYE EXAM

## 2017-07-11 LAB — LIPID PANEL
CHOL/HDL RATIO: 2
Cholesterol: 184 mg/dL (ref 0–200)
HDL: 93.8 mg/dL (ref >39.00)
LDL Cholesterol: 83 mg/dL (ref 0–99)
NonHDL: 90.18
TRIGLYCERIDES: 35 mg/dL (ref 0.0–149.0)
VLDL: 7 mg/dL (ref 0.0–40.0)

## 2017-07-11 NOTE — Assessment & Plan Note (Addendum)
--  Td 06/2017; pneumonia shot 23: 2010; PNM 13: 2015; zostavax --2015 --Female care h/o conization, several normal PAPs afterwards; Pap: 04/2012--neg . Refered to gyn 2017, no documentation.  Will refer again. Last MMG 05/2016, very busy d/t husband's health, will hold off on MMG this year -- CCS: 04/2012--Dr Jacobs--next due 2024  --Diet and exercise discussed

## 2017-07-11 NOTE — Progress Notes (Signed)
Subjective:    Patient ID: April Little, female    DOB: [birth year], 67 y.o.   MRN: [id number]  DOS:  07/11/2017 Type of visit - description : Routine visit Interval history: DM, sees endocrinologist regularly HTN: Good med compliance, no ambulatory BPs Asthma: Currently well controlled. Noted a  lump at the neck 2 months ago  Review of Systems Denies chest pain or difficulty breathing She remains extremely active without DOE. No nausea, vomiting, diarrhea. Some stress related to her husband's health.  Past Medical History:  Diagnosis Date  . Asthma   . Diabetes type I ([place])    on a pump , f/u Dr Buddy Duty  . Hyperlipidemia   . Menopause    age 52    Past Surgical History:  Procedure Laterality Date  . BLEPHAROPLASTY    . CATARACT EXTRACTION  ~ 2016   R  . CERVICAL CONE BIOPSY     in her 76s  . KNEE ARTHROSCOPY  11-16-2011   L    Social History   Socioeconomic History  . Marital status: Married    Spouse name: Not on file  . Number of children: 2  . Years of education: Not on file  . Highest education level: Not on file  Occupational History  . Occupation: stay home mom  Social Needs  . Financial resource strain: Not on file  . Food insecurity:    Worry: Not on file    Inability: Not on file  . Transportation needs:    Medical: Not on file    Non-medical: Not on file  Tobacco Use  . Smoking status: Former Smoker    Packs/day: 3.00    Years: 12.00    Pack years: 36.00    Types: Cigarettes    Last attempt to quit: 02/15/1979    Years since quitting: 38.4  . Smokeless tobacco: Never Used  . Tobacco comment: quit 1981  Substance and Sexual Activity  . Alcohol use: Yes    Alcohol/week: 0.0 oz    Comment: occasional  . Drug use: No  . Sexual activity: Never  Lifestyle  . Physical activity:    Days per week: Not on file    Minutes per session: Not on file  . Stress: Not on file  Relationships  . Social connections:    Talks on phone: Not on file   Gets together: Not on file    Attends religious service: Not on file    Active member of club or organization: Not on file    Attends meetings of clubs or organizations: Not on file    Relationship status: Not on file  . Intimate partner violence:    Fear of current or ex partner: Not on file    Emotionally abused: Not on file    Physically abused: Not on file    Forced sexual activity: Not on file  Other Topics Concern  . Not on file  Social History Narrative   Plays tennis ; has a disable son that depends on her       Allergies as of 07/11/2017   No Known Allergies     Medication List        Accurate as of 07/11/17  6:52 PM. Always use your most recent med list.          aspirin 81 MG tablet Take 81 mg by mouth daily.   Biotin 5000 MCG Tabs Take 1 tablet by mouth daily.   bisacodyl 5 MG  EC tablet Commonly known as:  DULCOLAX Take 5 mg by mouth daily as needed for moderate constipation. Reported on 06/02/2015   budesonide-formoterol 160-4.5 MCG/ACT inhaler Commonly known as:  SYMBICORT Inhale two puffs twice daily to prevent cough or wheeze   fexofenadine 180 MG tablet Commonly known as:  ALLEGRA Take 180 mg by mouth daily.   fluticasone 220 MCG/ACT inhaler Commonly known as:  FLOVENT HFA Inhale 2 puffs into the lungs 2 (two) times daily.   fluticasone 50 MCG/ACT nasal spray Commonly known as:  FLONASE Place 2 sprays into both nostrils daily.   GLUCAGON EMERGENCY 1 MG injection Generic drug:  glucagon Inject 1 mg into the vein once as needed. Reported on 06/02/2015   HUMALOG 100 UNIT/ML injection Generic drug:  insulin lispro Continuous insulin pump   losartan 25 MG tablet Commonly known as:  COZAAR Take 1 tablet (25 mg total) by mouth 2 (two) times daily.   MICROLET LANCETS Misc USE TO TEST BLOOD SUGAR SEVEN TIMES DAILY   Olopatadine HCl 0.7 % Soln Commonly known as:  PAZEO Place 1 drop into both eyes daily as needed.   simvastatin 10 MG  tablet Commonly known as:  ZOCOR Take 1 tablet (10 mg total) by mouth at bedtime.   VENTOLIN HFA 108 (90 Base) MCG/ACT inhaler Generic drug:  albuterol Inhale 2 puffs into the lungs every 4 (four) hours as needed for wheezing or shortness of breath.          Objective:   Physical Exam  Neck:     BP 126/66 (BP Location: Left Arm, Patient Position: Sitting, Cuff Size: Small)   Pulse 70   Temp 97.8 F (36.6 C) (Oral)   Resp 14   Ht 5\' 7"  (0.086 m)   Wt 171 lb (77.6 kg)   SpO2 91%   BMI 26.78 kg/m  General:   Well developed, well nourished . NAD.  Neck: No  thyromegaly  HEENT:  Normocephalic . Face symmetric, atraumatic Lungs:  CTA B Normal respiratory effort, no intercostal retractions, no accessory muscle use. Heart: RRR,  no murmur.  No pretibial edema bilaterally  Abdomen:  Not distended, soft, non-tender. No rebound or rigidity.   Skin: Exposed areas without rash. Not pale. Not jaundice Neurologic:  alert & oriented X3.  Speech normal, gait appropriate for age and unassisted Strength symmetric and appropriate for age.  Psych: Cognition and judgment appear intact.  Cooperative with normal attention span and concentration.  Behavior appropriate. No anxious or depressed appearing.     Assessment & Plan:   Assessment DM type I Dr. Buddy Duty HTN Hyperlipidemia Asthma Osteopenia: T score -1.5 (05-2009), had a DEXA 06/2015, T score -2.2 Menopause , age 73 + FH CAD  PLAN DM: Per Endo HTN: Continue with losartan (exact dose?).  Check CMP Hyperlipidemia: Continue simvastatin.  Check a FLP Asthma: Currently controlled on Symbicort and albuterol. Osteopenia: She is very active, recommend vitamin D.  We agreed to do a bone density test. Lipoma: Mass at the neck consistent with a lipoma, no red flags, for now recommend observation Also request a dermatology referral for a general checkup, she has a number of skin lesions;  Refer to Dr. Allyson Sabal office. Preventive care   discussed. RTC 1 year

## 2017-07-11 NOTE — Patient Instructions (Signed)
GO TO THE LAB : Get the blood work     GO TO THE FRONT DESK Schedule your next appointment   for a checkup in 1 year   

## 2017-07-11 NOTE — Assessment & Plan Note (Signed)
DM: Per Endo HTN: Continue with losartan (exact dose?).  Check CMP Hyperlipidemia: Continue simvastatin.  Check a FLP Asthma: Currently controlled on Symbicort and albuterol. Osteopenia: She is very active, recommend vitamin D.  We agreed to do a bone density test. Lipoma: Mass at the neck consistent with a lipoma, no red flags, for now recommend observation Also request a dermatology referral for a general checkup, she has a number of skin lesions;  Refer to Dr. Allyson Sabal office. Preventive care  discussed. RTC 1 year

## 2017-07-11 NOTE — Progress Notes (Signed)
Pre visit review using our clinic review tool, if applicable. No additional management support is needed unless otherwise documented below in the visit note. 

## 2017-07-13 ENCOUNTER — Telehealth: Payer: Self-pay | Admitting: Obstetrics and Gynecology

## 2017-07-13 NOTE — Telephone Encounter (Signed)
Called and left a message for patient to call back to schedule a new patient doctor referral appointment with our office to see Dr. Quincy Simmonds for an AEX. Patient picked up the call and said she'll need to think about scheduling this appointment and call back to schedule. Will check back with patient next week if I don't hear back from her.

## 2017-07-17 ENCOUNTER — Encounter: Payer: Self-pay | Admitting: Internal Medicine

## 2017-07-18 ENCOUNTER — Ambulatory Visit (INDEPENDENT_AMBULATORY_CARE_PROVIDER_SITE_OTHER): Payer: Medicare Other | Admitting: Allergy and Immunology

## 2017-07-18 ENCOUNTER — Encounter: Payer: Self-pay | Admitting: Allergy and Immunology

## 2017-07-18 VITALS — BP 124/78 | HR 78 | Resp 18

## 2017-07-18 DIAGNOSIS — J3089 Other allergic rhinitis: Secondary | ICD-10-CM

## 2017-07-18 DIAGNOSIS — J4550 Severe persistent asthma, uncomplicated: Secondary | ICD-10-CM

## 2017-07-18 DIAGNOSIS — D721 Eosinophilia, unspecified: Secondary | ICD-10-CM

## 2017-07-18 DIAGNOSIS — B001 Herpesviral vesicular dermatitis: Secondary | ICD-10-CM

## 2017-07-18 MED ORDER — FLUTICASONE PROPIONATE HFA 220 MCG/ACT IN AERO: 2.0000 | INHALATION_SPRAY | Freq: Two times a day (BID) | RESPIRATORY_TRACT | 3 refills | Status: DC

## 2017-07-18 MED ORDER — VALACYCLOVIR HCL 500 MG PO TABS: ORAL_TABLET | ORAL | 0 refills | Status: DC

## 2017-07-18 NOTE — Progress Notes (Signed)
Follow-up Note  Referring Provider: Colon Branch, MD Primary Provider: Colon Branch, MD Date of Office Visit: 07/18/2017  Subjective:   April Little (DOB: Feb 04, 1951) is a 67 y.o. female who returns to the [facility] and [place] on 07/18/2017 in re-evaluation of the following:  HPI: April Little returns to this clinic in reevaluation of her asthma with poor perception of dyspnea, allergic rhinitis, and nasal blisters.  Her last visit to this clinic was 28 Jun 2016.  As has been the case for years she does not really think that she has been having much problems with her asthma and rarely uses a short acting bronchodilator averaging out to just a few times per month and for the most part she thinks that she can exercise well.  However, she has wheezing on a very common basis.  She has always been somewhat frustrated with the fact that no matter what therapy she has been administered in the past it has never really cleared up her issue and she became quite jaded about using medications on a regular basis although she has been relatively good about using her Symbicort consistently.  It does not sound as though she has required a systemic steroid to treat an exacerbation.  Her nose does pretty well but she has noticed that over the course of the past several months she has developed a blistering dermatitis inside her nose at the very tip on both sides that will start with some discomfort and redness and then blistering and she has tried some Zovirax which has actually helped these blisters heal.  Allergies as of 07/18/2017   No Known Allergies     Medication List      aspirin 81 MG tablet Take 81 mg by mouth daily.   Biotin 5000 MCG Tabs Take 1 tablet by mouth daily.   bisacodyl 5 MG EC tablet Commonly known as:  DULCOLAX Take 5 mg by mouth daily as needed for moderate constipation. Reported on 06/02/2015   budesonide-formoterol 160-4.5 MCG/ACT inhaler Commonly known as:   SYMBICORT Inhale two puffs twice daily to prevent cough or wheeze   fexofenadine 180 MG tablet Commonly known as:  ALLEGRA Take 180 mg by mouth daily.   fluticasone 220 MCG/ACT inhaler Commonly known as:  FLOVENT HFA Inhale 2 puffs into the lungs 2 (two) times daily.   fluticasone 220 MCG/ACT inhaler Commonly known as:  FLOVENT HFA Inhale 2 puffs into the lungs 2 (two) times daily.   fluticasone 50 MCG/ACT nasal spray Commonly known as:  FLONASE Place 2 sprays into both nostrils daily.   GLUCAGON EMERGENCY 1 MG injection Generic drug:  glucagon Inject 1 mg into the vein once as needed. Reported on 06/02/2015   HUMALOG 100 UNIT/ML injection Generic drug:  insulin lispro Continuous insulin pump   losartan 25 MG tablet Commonly known as:  COZAAR Take 1 tablet (25 mg total) by mouth 2 (two) times daily.   MICROLET LANCETS Misc USE TO TEST BLOOD SUGAR SEVEN TIMES DAILY   Olopatadine HCl 0.7 % Soln Commonly known as:  PAZEO Place 1 drop into both eyes daily as needed.   simvastatin 10 MG tablet Commonly known as:  ZOCOR Take 1 tablet (10 mg total) by mouth at bedtime.   valACYclovir 500 MG tablet Commonly known as:  VALTREX Take 1 tablet twice daily for 10 days, then 1 tablet once daily for 4 weeks   VENTOLIN HFA 108 (90 Base) MCG/ACT inhaler Generic drug:  albuterol Inhale 2 puffs into the lungs every 4 (four) hours as needed for wheezing or shortness of breath.       Past Medical History:  Diagnosis Date  . Asthma   . Diabetes type I ([place])    on a pump , f/u Dr April Little  . Hyperlipidemia   . Menopause    age 20    Past Surgical History:  Procedure Laterality Date  . BLEPHAROPLASTY    . CATARACT EXTRACTION  ~ 2016   R  . CERVICAL CONE BIOPSY     in her 80s  . KNEE ARTHROSCOPY  11-16-2011   L    Review of systems negative except as noted in HPI / PMHx or noted below:  Review of Systems  Constitutional: Negative.   HENT: Negative.   Eyes: Negative.    Respiratory: Negative.   Cardiovascular: Negative.   Gastrointestinal: Negative.   Genitourinary: Negative.   Musculoskeletal: Negative.   Skin: Negative.   Neurological: Negative.   Endo/Heme/Allergies: Negative.   Psychiatric/Behavioral: Negative.      Objective:   Vitals:   07/18/17 1750  BP: 124/78  Pulse: 78  Resp: 18  SpO2: 98%          Physical Exam  HENT:  Head: Normocephalic.  Right Ear: Tympanic membrane, external ear and ear canal normal.  Left Ear: Tympanic membrane, external ear and ear canal normal.  Nose: Nose normal. No mucosal edema or rhinorrhea.  Mouth/Throat: Uvula is midline, oropharynx is clear and moist and mucous membranes are normal. No oropharyngeal exudate.  Eyes: Conjunctivae are normal.  Neck: Trachea normal. No tracheal tenderness present. No tracheal deviation present. No thyromegaly present.  Cardiovascular: Normal rate, regular rhythm, S1 normal, S2 normal and normal heart sounds.  No murmur heard. Pulmonary/Chest: Breath sounds normal. No stridor. No respiratory distress. She has no wheezes (Bilateral expiratory wheezes posterior lung fields). She has no rales.  Musculoskeletal: She exhibits no edema.  Lymphadenopathy:       Head (right side): No tonsillar adenopathy present.       Head (left side): No tonsillar adenopathy present.    She has no cervical adenopathy.  Neurological: She is alert.  Skin: No rash noted. She is not diaphoretic. No erythema. Nails show no clubbing.    Diagnostics:    Spirometry was performed and demonstrated an FEV1 of 1.25 at 47 % of predicted.  The patient had an Asthma Control Test with the following results: ACT Total Score: 17.    Results of blood tests obtained 25 May 2016 identified WBC 8.5, absolute eosinophil 600, absolute lymphocyte 1300, hemoglobin 13.1, platelet 258  Assessment and Plan:   1. Not well controlled severe persistent asthma   2. Other allergic rhinitis   3. Herpes  labialis   4. Eosinophilia     1. Treat and prevent inflammation:   A. Symbicort 160 - two inhalations two times per day  B. Flonase 1-2 sprays each nostril one time per day  2. If needed:   A. OTC antihistamine - Zyrtec (sedation?) or claritin  B. nasal saline spray  C. Pazeo one drop each eye one time per day  D. Proventil HFA or ProAir HFA 2 puffs every 4-6 hours if needed  3. "Action plan" for asthma flare up:   A. add Flovent 220 to Symbicort - 2 inhalations twice a day   B. use Proventil HFA or ProAir HFA 2 puffs every 4-6 hours if needed  4. Start valtrex 500  one tablet two times a day for 10 days, then one tablet every day until return to clinic in 4 weeks  5. Anti-IL-5 biological agent?  6. Return to clinic in  4 weeks  Stamatia once again shows signs of significant inflammation affecting her respiratory tract and no therapy that has been administered in the past has resulted in good control of this issue and I think she may be a candidate for a anti-IL-5 biologic agent especially given her eosinophilia.  She will continue to use anti-inflammatory agents for her airway as noted above.  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  I will regroup with her in 4 weeks to assess her response to this approach and further explore the possibility of using an anti-IL-5 biologic agent.  Allena Katz, MD Allergy / Immunology [place]

## 2017-07-18 NOTE — Patient Instructions (Addendum)
  1. Treat and prevent inflammation:   A. Symbicort 160 - two inhalations two times per day  B. Flonase 1-2 sprays each nostril one time per day  2. If needed:   A. OTC antihistamine - Zyrtec (sedation?) or claritin  B. nasal saline spray  C. Pazeo one drop each eye one time per day  D. Proventil HFA or ProAir HFA 2 puffs every 4-6 hours if needed  3. "Action plan" for asthma flare up:   A. add Flovent 220 to Symbicort - 2 inhalations twice a day   B. use Proventil HFA or ProAir HFA 2 puffs every 4-6 hours if needed  4. Start valtrex 500 one tablet two times a day for 10 days, then one tablet every day until return to clinic in 4 weeks  5. Anti-IL-5 biological agent?  6. Return to clinic in  4 weeks

## 2017-07-19 ENCOUNTER — Encounter: Payer: Self-pay | Admitting: Allergy and Immunology

## 2017-07-19 ENCOUNTER — Telehealth: Payer: Self-pay | Admitting: Allergy and Immunology

## 2017-07-19 ENCOUNTER — Other Ambulatory Visit (INDEPENDENT_AMBULATORY_CARE_PROVIDER_SITE_OTHER): Payer: Medicare Other

## 2017-07-19 DIAGNOSIS — E875 Hyperkalemia: Secondary | ICD-10-CM

## 2017-07-19 LAB — BASIC METABOLIC PANEL
BUN: 11 mg/dL (ref 6–23)
CALCIUM: 9.2 mg/dL (ref 8.4–10.5)
CHLORIDE: 106 meq/L (ref 96–112)
CO2: 28 mEq/L (ref 19–32)
CREATININE: 0.86 mg/dL (ref 0.40–1.20)
GFR: 69.89 mL/min (ref >60.00)
Glucose, Bld: 169 mg/dL — ABNORMAL HIGH (ref 70–99)
Potassium: 4.4 mEq/L (ref 3.5–5.1)
SODIUM: 140 meq/L (ref 135–145)

## 2017-07-19 NOTE — Telephone Encounter (Signed)
Cathleen A, LPN spoke to pharmacy and clarified amount. Amount is correct patient will return to clinic 08/15/17

## 2017-07-19 NOTE — Telephone Encounter (Signed)
April Little left detailed message advising that amount is correct

## 2017-07-19 NOTE — Telephone Encounter (Signed)
Pt called and said that the Valacyclovic was called in for 30 pills instead of 58 pills. [phone].

## 2017-07-19 NOTE — Telephone Encounter (Signed)
Called and left a message for patient to call back to schedule a new patient doctor referral appointment with our office to see Dr. Silva for an AEX.  °

## 2017-07-21 ENCOUNTER — Other Ambulatory Visit: Payer: Self-pay

## 2017-07-21 MED ORDER — VALACYCLOVIR HCL 500 MG PO TABS: ORAL_TABLET | ORAL | 0 refills | Status: DC

## 2017-07-21 NOTE — Telephone Encounter (Signed)
Called and left a message for patient to call back to schedule a new patient doctor referral appointment with our office to see Dr. Silva for an AEX.  °

## 2017-07-24 ENCOUNTER — Other Ambulatory Visit: Payer: Self-pay

## 2017-07-24 DIAGNOSIS — J4550 Severe persistent asthma, uncomplicated: Secondary | ICD-10-CM

## 2017-07-24 NOTE — Telephone Encounter (Signed)
Patient has not returned multiple calls to schedule an appointment with our office. Routing back to referring office.

## 2017-07-26 ENCOUNTER — Ambulatory Visit: Payer: Medicare Other

## 2017-07-26 ENCOUNTER — Telehealth: Payer: Self-pay | Admitting: *Deleted

## 2017-07-26 MED ORDER — EPINEPHRINE 0.3 MG/0.3ML IJ SOAJ: 0.3000 mg | Freq: Once | INTRAMUSCULAR | 1 refills | Status: AC

## 2017-07-26 NOTE — Telephone Encounter (Signed)
Called patient and advised April Little start buy and bill and coverage.  Scheduled appt for tomorrow for same and epipen rx sent in

## 2017-07-27 ENCOUNTER — Ambulatory Visit (INDEPENDENT_AMBULATORY_CARE_PROVIDER_SITE_OTHER): Payer: Medicare Other | Admitting: *Deleted

## 2017-07-27 DIAGNOSIS — J4550 Severe persistent asthma, uncomplicated: Secondary | ICD-10-CM

## 2017-07-27 NOTE — Progress Notes (Signed)
Immunotherapy   Patient Details  Name: April Little MRN: [id number] Date of Birth: 06-Feb-1951  07/27/2017  [name] started injections for  Fasenra 30 mg every 28 days for 3 doses then every 8 weeks. Epi-Pen:Epi-Pen Available  Consent signed and patient instructions given. No problems after 30 minutes in the office.   [name] 07/27/2017, 6:10 PM

## 2017-07-31 MED ORDER — BENRALIZUMAB 30 MG/ML ~~LOC~~ SOSY
30.0000 mg | PREFILLED_SYRINGE | SUBCUTANEOUS | Status: AC
  Administered 2017-07-27 – 2017-09-21 (×3): 30 mg via SUBCUTANEOUS

## 2017-08-10 DIAGNOSIS — L821 Other seborrheic keratosis: Secondary | ICD-10-CM | POA: Diagnosis not present

## 2017-08-10 DIAGNOSIS — L819 Disorder of pigmentation, unspecified: Secondary | ICD-10-CM | POA: Diagnosis not present

## 2017-08-10 DIAGNOSIS — L814 Other melanin hyperpigmentation: Secondary | ICD-10-CM | POA: Diagnosis not present

## 2017-08-10 DIAGNOSIS — L309 Dermatitis, unspecified: Secondary | ICD-10-CM | POA: Diagnosis not present

## 2017-08-10 DIAGNOSIS — D229 Melanocytic nevi, unspecified: Secondary | ICD-10-CM | POA: Diagnosis not present

## 2017-08-15 ENCOUNTER — Ambulatory Visit (INDEPENDENT_AMBULATORY_CARE_PROVIDER_SITE_OTHER): Payer: Medicare Other | Admitting: Allergy and Immunology

## 2017-08-15 ENCOUNTER — Encounter: Payer: Self-pay | Admitting: Allergy and Immunology

## 2017-08-15 VITALS — BP 120/76 | HR 80 | Resp 18

## 2017-08-15 DIAGNOSIS — J4550 Severe persistent asthma, uncomplicated: Secondary | ICD-10-CM

## 2017-08-15 DIAGNOSIS — D721 Eosinophilia, unspecified: Secondary | ICD-10-CM

## 2017-08-15 DIAGNOSIS — J3089 Other allergic rhinitis: Secondary | ICD-10-CM

## 2017-08-15 MED ORDER — ACYCLOVIR 5 % EX OINT: 1.0000 "application " | TOPICAL_OINTMENT | Freq: Every day | CUTANEOUS | 5 refills | Status: AC | PRN

## 2017-08-15 NOTE — Progress Notes (Signed)
Follow-up Note  Referring Provider: Colon Branch, MD Primary Provider: Colon Branch, MD Date of Office Visit: 08/15/2017  Subjective:   April Little (DOB: [birth year]) is a 67 y.o. female who returns to the All[facility] and [place] on 08/15/2017 in re-evaluation of the following:  HPI: Ahsha returns to this clinic in reevaluation of her asthma and allergic rhinitis and nasal blisters.  Her last visit to this clinic was 18 July 2017.  She has started benralizumab injections and continues to use Symbicort on a regular basis.  Overall she feels about the same.  She still can perform in tennis without any problem and she rarely uses a short acting bronchodilator.  She took her Valtrex over the course of the past month and even though she was using Valtrex on a consistent basis in a preventative manner she developed blisters in her nose again and used Zovirax cream.  Allergies as of 08/15/2017   No Known Allergies     Medication List      bisacodyl 5 MG EC tablet Commonly known as:  DULCOLAX Take 5 mg by mouth daily as needed for moderate constipation. Reported on 06/02/2015   budesonide-formoterol 160-4.5 MCG/ACT inhaler Commonly known as:  SYMBICORT Inhale two puffs twice daily to prevent cough or wheeze   EPINEPHrine 0.3 mg/0.3 mL Soaj injection Commonly known as:  EPI-PEN   fexofenadine 180 MG tablet Commonly known as:  ALLEGRA Take 180 mg by mouth daily.   fluticasone 220 MCG/ACT inhaler Commonly known as:  FLOVENT HFA Inhale 2 puffs into the lungs 2 (two) times daily.   fluticasone 50 MCG/ACT nasal spray Commonly known as:  FLONASE Place 2 sprays into both nostrils daily.   HUMALOG 100 UNIT/ML injection Generic drug:  insulin lispro Continuous insulin pump   losartan 25 MG tablet Commonly known as:  COZAAR Take 1 tablet (25 mg total) by mouth 2 (two) times daily.   MICROLET LANCETS Misc USE TO TEST BLOOD SUGAR SEVEN TIMES DAILY   simvastatin 10 MG  tablet Commonly known as:  ZOCOR Take 1 tablet (10 mg total) by mouth at bedtime.   valACYclovir 500 MG tablet Commonly known as:  VALTREX Take 1 tablet twice daily for 10 days, then 1 tablet once daily for 4 weeks   VENTOLIN HFA 108 (90 Base) MCG/ACT inhaler Generic drug:  albuterol Inhale 2 puffs into the lungs every 4 (four) hours as needed for wheezing or shortness of breath.       Past Medical History:  Diagnosis Date  . Asthma   . Diabetes type I ([place])    on a pump , f/u Dr Buddy Duty  . Hyperlipidemia   . Menopause    age 72    Past Surgical History:  Procedure Laterality Date  . BLEPHAROPLASTY    . CATARACT EXTRACTION  ~ 2016   R  . CERVICAL CONE BIOPSY     in her 30s  . KNEE ARTHROSCOPY  11-16-2011   L    Review of systems negative except as noted in HPI / PMHx or noted below:  Review of Systems  Constitutional: Negative.   HENT: Negative.   Eyes: Negative.   Respiratory: Negative.   Cardiovascular: Negative.   Gastrointestinal: Negative.   Genitourinary: Negative.   Musculoskeletal: Negative.   Skin: Negative.   Neurological: Negative.   Endo/Heme/Allergies: Negative.   Psychiatric/Behavioral: Negative.      Objective:   Vitals:   08/15/17 1708  BP: 120/76  Pulse: 80  Resp: 18  SpO2: 96%          Physical Exam  HENT:  Head: Normocephalic.  Right Ear: Tympanic membrane, external ear and ear canal normal.  Left Ear: Tympanic membrane, external ear and ear canal normal.  Nose: Nose normal. No mucosal edema or rhinorrhea.  Mouth/Throat: Uvula is midline, oropharynx is clear and moist and mucous membranes are normal. No oropharyngeal exudate.  Eyes: Conjunctivae are normal.  Neck: Trachea normal. No tracheal tenderness present. No tracheal deviation present. No thyromegaly present.  Cardiovascular: Normal rate, regular rhythm, S1 normal, S2 normal and normal heart sounds.  No murmur heard. Pulmonary/Chest: Breath sounds normal. No stridor. No  respiratory distress. She has no wheezes. She has no rales.  Musculoskeletal: She exhibits no edema.  Lymphadenopathy:       Head (right side): No tonsillar adenopathy present.       Head (left side): No tonsillar adenopathy present.    She has no cervical adenopathy.  Neurological: She is alert.  Skin: No rash noted. She is not diaphoretic. No erythema. Nails show no clubbing.    Diagnostics:    Spirometry was performed and demonstrated an FEV1 of 2.21 at 84 % of predicted.  The patient had an Asthma Control Test with the following results: ACT Total Score: 24.    Assessment and Plan:   1. Asthma, severe persistent, well-controlled   2. Other allergic rhinitis   3. Eosinophilia     1. Treat and prevent inflammation:   A. Symbicort 160 - two inhalations two times per day  B. Flonase 1-2 sprays each nostril one time per day  C. Benralizumab injections  2. If needed:   A. OTC antihistamine   B. nasal saline spray  C. Patanol one drop each eye two time per day  D. Proventil HFA or ProAir HFA 2 puffs every 4-6 hours if needed  3. "Action plan" for asthma flare up:   A. add Flovent 220 to Symbicort - 2 inhalations twice a day   B. use Proventil HFA or ProAir HFA 2 puffs every 4-6 hours if needed  4.  Zovirax ointment to nose as needed  6. Return to clinic in 12 weeks  7.  Obtain fall flu vaccine  We will now see how the next 3 months goes while Elenora continues to use benralizumab injections in addition to her other anti-inflammatory agents.  Hopefully with continuous use of benralizumab will be able to lower her dose of other anti-inflammatory agents.  She has had a very significant improvement in her lung function as a result of this treatment.  Valtrex did not help her nose very much and she can go back to using Zovirax ointment as needed.  Her Flonase may be contributing to some of the irritation of her nose and I did have a talk with her today about the fact that she  may not need to use Flonase if she gets a good effect from benralizumab.  I will see her back in this clinic in 12 weeks or earlier if there is a problem.  Allena Katz, MD Allergy / Immunology [place]

## 2017-08-15 NOTE — Patient Instructions (Addendum)
  1. Treat and prevent inflammation:   A. Symbicort 160 - two inhalations two times per day  B. Flonase 1-2 sprays each nostril one time per day  C. Benralizumab injections  2. If needed:   A. OTC antihistamine   B. nasal saline spray  C. Patanol one drop each eye two time per day  D. Proventil HFA or ProAir HFA 2 puffs every 4-6 hours if needed  3. "Action plan" for asthma flare up:   A. add Flovent 220 to Symbicort - 2 inhalations twice a day   B. use Proventil HFA or ProAir HFA 2 puffs every 4-6 hours if needed  4.  Zovirax ointment to nose as needed  6. Return to clinic in 12 weeks  7.  Obtain fall flu vaccine

## 2017-08-16 ENCOUNTER — Encounter: Payer: Self-pay | Admitting: Allergy and Immunology

## 2017-08-23 DIAGNOSIS — J4550 Severe persistent asthma, uncomplicated: Secondary | ICD-10-CM | POA: Diagnosis not present

## 2017-08-23 DIAGNOSIS — S52572A Other intraarticular fracture of lower end of left radius, initial encounter for closed fracture: Secondary | ICD-10-CM | POA: Diagnosis not present

## 2017-08-24 ENCOUNTER — Ambulatory Visit (INDEPENDENT_AMBULATORY_CARE_PROVIDER_SITE_OTHER): Payer: Medicare Other | Admitting: *Deleted

## 2017-08-24 DIAGNOSIS — J4550 Severe persistent asthma, uncomplicated: Secondary | ICD-10-CM | POA: Diagnosis not present

## 2017-08-25 ENCOUNTER — Other Ambulatory Visit: Payer: Self-pay | Admitting: Internal Medicine

## 2017-08-31 DIAGNOSIS — S52572A Other intraarticular fracture of lower end of left radius, initial encounter for closed fracture: Secondary | ICD-10-CM | POA: Diagnosis not present

## 2017-09-08 DIAGNOSIS — S52572A Other intraarticular fracture of lower end of left radius, initial encounter for closed fracture: Secondary | ICD-10-CM | POA: Diagnosis not present

## 2017-09-20 DIAGNOSIS — J4550 Severe persistent asthma, uncomplicated: Secondary | ICD-10-CM | POA: Diagnosis not present

## 2017-09-21 ENCOUNTER — Ambulatory Visit (INDEPENDENT_AMBULATORY_CARE_PROVIDER_SITE_OTHER): Payer: Medicare Other | Admitting: *Deleted

## 2017-09-21 DIAGNOSIS — J4550 Severe persistent asthma, uncomplicated: Secondary | ICD-10-CM | POA: Diagnosis not present

## 2017-09-22 DIAGNOSIS — S52572D Other intraarticular fracture of lower end of left radius, subsequent encounter for closed fracture with routine healing: Secondary | ICD-10-CM | POA: Diagnosis not present

## 2017-10-06 DIAGNOSIS — E559 Vitamin D deficiency, unspecified: Secondary | ICD-10-CM | POA: Diagnosis not present

## 2017-10-06 DIAGNOSIS — Z8781 Personal history of (healed) traumatic fracture: Secondary | ICD-10-CM | POA: Diagnosis not present

## 2017-10-06 DIAGNOSIS — E1042 Type 1 diabetes mellitus with diabetic polyneuropathy: Secondary | ICD-10-CM | POA: Diagnosis not present

## 2017-10-06 DIAGNOSIS — Z794 Long term (current) use of insulin: Secondary | ICD-10-CM | POA: Diagnosis not present

## 2017-10-06 DIAGNOSIS — Z13820 Encounter for screening for osteoporosis: Secondary | ICD-10-CM | POA: Diagnosis not present

## 2017-10-06 DIAGNOSIS — E1065 Type 1 diabetes mellitus with hyperglycemia: Secondary | ICD-10-CM | POA: Diagnosis not present

## 2017-10-06 LAB — BASIC METABOLIC PANEL
BUN: 23 — AB (ref 4–21)
CREATININE: 1 (ref 0.5–1.1)
GLUCOSE: 289
Potassium: 4.2 (ref 3.4–5.3)
Sodium: 135 — AB (ref 137–147)

## 2017-10-06 LAB — TSH: TSH: 2.78 (ref 0.41–5.90)

## 2017-10-06 LAB — HEMOGLOBIN A1C: HEMOGLOBIN A1C: 8.2

## 2017-10-06 LAB — HEPATIC FUNCTION PANEL
ALT: 17 (ref 7–35)
AST: 20 (ref 13–35)
Alkaline Phosphatase: 80 (ref 25–125)
Bilirubin, Total: 0.4

## 2017-10-06 LAB — VITAMIN D 25 HYDROXY (VIT D DEFICIENCY, FRACTURES): VIT D 25 HYDROXY: 34.3

## 2017-10-06 LAB — MICROALBUMIN, URINE

## 2017-10-18 DIAGNOSIS — S52572D Other intraarticular fracture of lower end of left radius, subsequent encounter for closed fracture with routine healing: Secondary | ICD-10-CM | POA: Diagnosis not present

## 2017-11-07 ENCOUNTER — Encounter: Payer: Self-pay | Admitting: Allergy and Immunology

## 2017-11-07 ENCOUNTER — Ambulatory Visit (INDEPENDENT_AMBULATORY_CARE_PROVIDER_SITE_OTHER): Payer: Medicare Other | Admitting: Allergy and Immunology

## 2017-11-07 VITALS — BP 116/68 | HR 80 | Resp 16

## 2017-11-07 DIAGNOSIS — B001 Herpesviral vesicular dermatitis: Secondary | ICD-10-CM | POA: Diagnosis not present

## 2017-11-07 DIAGNOSIS — D721 Eosinophilia, unspecified: Secondary | ICD-10-CM

## 2017-11-07 DIAGNOSIS — J3089 Other allergic rhinitis: Secondary | ICD-10-CM

## 2017-11-07 DIAGNOSIS — R5383 Other fatigue: Secondary | ICD-10-CM

## 2017-11-07 DIAGNOSIS — J4550 Severe persistent asthma, uncomplicated: Secondary | ICD-10-CM

## 2017-11-07 NOTE — Patient Instructions (Addendum)
  1. Treat and prevent inflammation:   A. Benralizumab injections  2. If needed:   A. OTC antihistamine   B. nasal saline spray  C. Patanol one drop each eye two time per day  D. Proventil HFA or ProAir HFA 2 puffs every 4-6 hours if needed  3. "Action plan" for asthma flare up:   A. Flovent 220 - 2 inhalations twice a day   B. Symbicort 160 - 2 inhalations twice a day  c.  Proventil HFA or ProAir HFA 2 puffs every 4-6 hours if needed  4.  Zovirax ointment to nose as needed  5. Decrease use of OTC sleep aid. Continued fatigue problems?  6. Return to clinic in February 2020  7.  Obtain fall flu vaccine

## 2017-11-07 NOTE — Progress Notes (Signed)
Follow-up Note  Referring Provider: Colon Branch, MD Primary Provider: Colon Branch, MD Date of Office Visit: 11/07/2017  Subjective:   April Little (DOB: January 16, 1951) is a 67 y.o. female who returns to the Allergy and [place] on 11/07/2017 in re-evaluation of the following:  HPI: April Little returns to this clinic in reevaluation of asthma and allergic rhinitis and history of nasal blisters and fatigue.  Her last visit to this clinic was 15 August 2017.  She is doing very well with her respiratory tract.  She has stopped her Symbicort and stopped her nasal steroid and relies on the use of benralizumab injections to control her disease state.  She rarely uses a short acting bronchodilator and has been able to perform in tennis with no problem.  She has had no issues with her nose as well.  She has not been having any nasal blisters requiring the use of topical Zovirax at this point.  She has been having fatigue.  She has fatigue in the morning.  She is very sleepy upon awakening and this resolves around 12 noon or so.  She does not snore or gasp or have disturbed sleep.  She has always been using a Tylenol PM medication to treat insomnia.  However, she changed to a different formulation of over-the-counter sleep aid with the use of Aleve PM about 10 weeks ago.  She made this change because she had a fracture of her wrist and she was looking for some anti-inflammatory properties from her medications while also getting the benefit of a sleep aid.  Allergies as of 11/07/2017   No Known Allergies     Medication List      acyclovir ointment 5 % Commonly known as:  ZOVIRAX Apply 1 application topically daily as needed.   bisacodyl 5 MG EC tablet Commonly known as:  DULCOLAX Take 5 mg by mouth daily as needed for moderate constipation. Reported on 06/02/2015   budesonide-formoterol 160-4.5 MCG/ACT inhaler Commonly known as:  SYMBICORT Inhale two puffs twice daily to prevent cough or  wheeze   EPINEPHrine 0.3 mg/0.3 mL Soaj injection Commonly known as:  EPI-PEN   fexofenadine 180 MG tablet Commonly known as:  ALLEGRA Take 180 mg by mouth daily.   fluticasone 220 MCG/ACT inhaler Commonly known as:  FLOVENT HFA Inhale 2 puffs into the lungs 2 (two) times daily.   fluticasone 50 MCG/ACT nasal spray Commonly known as:  FLONASE Place 2 sprays into both nostrils daily.   HUMALOG 100 UNIT/ML injection Generic drug:  insulin lispro Continuous insulin pump   losartan 25 MG tablet Commonly known as:  COZAAR Take 1 tablet (25 mg total) by mouth 2 (two) times daily.   MICROLET LANCETS Misc USE TO TEST BLOOD SUGAR SEVEN TIMES DAILY   simvastatin 10 MG tablet Commonly known as:  ZOCOR Take 1 tablet (10 mg total) by mouth at bedtime.   valACYclovir 500 MG tablet Commonly known as:  VALTREX Take 1 tablet twice daily for 10 days, then 1 tablet once daily for 4 weeks   VENTOLIN HFA 108 (90 Base) MCG/ACT inhaler Generic drug:  albuterol Inhale 2 puffs into the lungs every 4 (four) hours as needed for wheezing or shortness of breath.       Past Medical History:  Diagnosis Date  . Asthma   . Diabetes type I ([place])    on a pump , f/u Dr Buddy Duty  . Hyperlipidemia   . Menopause  age 24    Past Surgical History:  Procedure Laterality Date  . BLEPHAROPLASTY    . CATARACT EXTRACTION  ~ 2016   R  . CERVICAL CONE BIOPSY     in her 38s  . KNEE ARTHROSCOPY  11-16-2011   L    Review of systems negative except as noted in HPI / PMHx or noted below:  Review of Systems  Constitutional: Negative.   HENT: Negative.   Eyes: Negative.   Respiratory: Negative.   Cardiovascular: Negative.   Gastrointestinal: Negative.   Genitourinary: Negative.   Musculoskeletal: Negative.   Skin: Negative.   Neurological: Negative.   Endo/Heme/Allergies: Negative.   Psychiatric/Behavioral: Negative.      Objective:   Vitals:   11/07/17 1628  BP: 116/68  Pulse: 80    Resp: 16          Physical Exam  HENT:  Head: Normocephalic.  Right Ear: Tympanic membrane, external ear and ear canal normal.  Left Ear: Tympanic membrane, external ear and ear canal normal.  Nose: Nose normal. No mucosal edema or rhinorrhea.  Mouth/Throat: Uvula is midline, oropharynx is clear and moist and mucous membranes are normal. No oropharyngeal exudate.  Eyes: Conjunctivae are normal.  Neck: Trachea normal. No tracheal tenderness present. No tracheal deviation present. No thyromegaly present.  Cardiovascular: Normal rate, regular rhythm, S1 normal, S2 normal and normal heart sounds.  No murmur heard. Pulmonary/Chest: Breath sounds normal. No stridor. No respiratory distress. She has no wheezes. She has no rales.  Musculoskeletal: She exhibits no edema.  Lymphadenopathy:       Head (right side): No tonsillar adenopathy present.       Head (left side): No tonsillar adenopathy present.    She has no cervical adenopathy.  Neurological: She is alert.  Skin: No rash noted. She is not diaphoretic. No erythema. Nails show no clubbing.    Diagnostics:    Spirometry was performed and demonstrated an FEV1 of 2.31 at 88 % of predicted.  The patient had an Asthma Control Test with the following results: ACT Total Score: 25.    Assessment and Plan:   1. Asthma, severe persistent, well-controlled   2. Other allergic rhinitis   3. Eosinophilia   4. Herpes labialis   5. Other fatigue     1. Treat and prevent inflammation:   A. Benralizumab injections  2. If needed:   A. OTC antihistamine   B. nasal saline spray  C. Patanol one drop each eye two time per day  D. Proventil HFA or ProAir HFA 2 puffs every 4-6 hours if needed  3. "Action plan" for asthma flare up:   A. Flovent 220 - 2 inhalations twice a day   B. Symbicort 160 - 2 inhalations twice a day  c.  Proventil HFA or ProAir HFA 2 puffs every 4-6 hours if needed  4.  Zovirax ointment to nose as needed  5.  Decrease use of OTC sleep aid. Continued fatigue problems?  6. Return to clinic in February 2020  7.  Obtain fall flu vaccine  April Little appears to be doing relatively well from her respiratory standpoint with the use of benralizumab and she will continue to use his biological agent.  It is quite impressive that the use of this agent has eliminated the requirement for any other medications regarding her eosinophilic driven respiratory tract issue and her lung function has increased significantly.  She still has an action plan to initiate should she develop a flareup  in the future.  Her fatigue is probably a reflection of using the over-the-counter sleep aid and I have asked her to work through this issue over the course of the next few weeks.  If she still continues to have fatigue she will require further evaluation and treatment.  If she does well I will see her back in this clinic in February 2020.  Allena Katz, MD Allergy / Immunology [place]

## 2017-11-08 ENCOUNTER — Encounter: Payer: Self-pay | Admitting: Allergy and Immunology

## 2017-11-15 DIAGNOSIS — S52572D Other intraarticular fracture of lower end of left radius, subsequent encounter for closed fracture with routine healing: Secondary | ICD-10-CM | POA: Diagnosis not present

## 2017-11-17 DIAGNOSIS — J4550 Severe persistent asthma, uncomplicated: Secondary | ICD-10-CM | POA: Diagnosis not present

## 2017-11-20 ENCOUNTER — Ambulatory Visit: Payer: Self-pay

## 2017-11-20 ENCOUNTER — Ambulatory Visit (INDEPENDENT_AMBULATORY_CARE_PROVIDER_SITE_OTHER): Payer: Medicare Other

## 2017-11-20 DIAGNOSIS — Z780 Asymptomatic menopausal state: Secondary | ICD-10-CM | POA: Diagnosis not present

## 2017-11-20 DIAGNOSIS — M8588 Other specified disorders of bone density and structure, other site: Secondary | ICD-10-CM | POA: Diagnosis not present

## 2017-11-20 DIAGNOSIS — J4550 Severe persistent asthma, uncomplicated: Secondary | ICD-10-CM

## 2017-11-20 DIAGNOSIS — Z8781 Personal history of (healed) traumatic fracture: Secondary | ICD-10-CM | POA: Diagnosis not present

## 2017-11-20 LAB — HM DEXA SCAN

## 2017-11-20 MED ORDER — BENRALIZUMAB 30 MG/ML ~~LOC~~ SOSY
30.0000 mg | PREFILLED_SYRINGE | Freq: Once | SUBCUTANEOUS | Status: AC
  Administered 2017-11-20: 30 mg via SUBCUTANEOUS

## 2017-12-07 DIAGNOSIS — L819 Disorder of pigmentation, unspecified: Secondary | ICD-10-CM | POA: Diagnosis not present

## 2017-12-18 DIAGNOSIS — S52572D Other intraarticular fracture of lower end of left radius, subsequent encounter for closed fracture with routine healing: Secondary | ICD-10-CM | POA: Diagnosis not present

## 2018-01-09 DIAGNOSIS — Z23 Encounter for immunization: Secondary | ICD-10-CM | POA: Diagnosis not present

## 2018-01-09 DIAGNOSIS — E1042 Type 1 diabetes mellitus with diabetic polyneuropathy: Secondary | ICD-10-CM | POA: Diagnosis not present

## 2018-01-09 DIAGNOSIS — M8580 Other specified disorders of bone density and structure, unspecified site: Secondary | ICD-10-CM | POA: Diagnosis not present

## 2018-01-09 DIAGNOSIS — Z8781 Personal history of (healed) traumatic fracture: Secondary | ICD-10-CM | POA: Diagnosis not present

## 2018-01-09 DIAGNOSIS — Z794 Long term (current) use of insulin: Secondary | ICD-10-CM | POA: Diagnosis not present

## 2018-01-09 LAB — HEMOGLOBIN A1C: Hemoglobin A1C: 7.9

## 2018-01-15 ENCOUNTER — Ambulatory Visit (INDEPENDENT_AMBULATORY_CARE_PROVIDER_SITE_OTHER): Payer: Medicare Other | Admitting: *Deleted

## 2018-01-15 DIAGNOSIS — J4550 Severe persistent asthma, uncomplicated: Secondary | ICD-10-CM | POA: Diagnosis not present

## 2018-01-15 MED ORDER — BENRALIZUMAB 30 MG/ML ~~LOC~~ SOSY
30.0000 mg | PREFILLED_SYRINGE | Freq: Once | SUBCUTANEOUS | Status: AC
  Administered 2018-01-15: 30 mg via SUBCUTANEOUS

## 2018-01-16 DIAGNOSIS — J4550 Severe persistent asthma, uncomplicated: Secondary | ICD-10-CM

## 2018-01-23 ENCOUNTER — Encounter: Payer: Self-pay | Admitting: Internal Medicine

## 2018-01-29 ENCOUNTER — Encounter: Payer: Self-pay | Admitting: Internal Medicine

## 2018-02-26 DIAGNOSIS — H25012 Cortical age-related cataract, left eye: Secondary | ICD-10-CM | POA: Diagnosis not present

## 2018-02-26 DIAGNOSIS — H2512 Age-related nuclear cataract, left eye: Secondary | ICD-10-CM | POA: Diagnosis not present

## 2018-02-26 DIAGNOSIS — E113211 Type 2 diabetes mellitus with mild nonproliferative diabetic retinopathy with macular edema, right eye: Secondary | ICD-10-CM | POA: Diagnosis not present

## 2018-02-26 DIAGNOSIS — E113292 Type 2 diabetes mellitus with mild nonproliferative diabetic retinopathy without macular edema, left eye: Secondary | ICD-10-CM | POA: Diagnosis not present

## 2018-02-26 DIAGNOSIS — H04123 Dry eye syndrome of bilateral lacrimal glands: Secondary | ICD-10-CM | POA: Diagnosis not present

## 2018-02-26 DIAGNOSIS — H5310 Unspecified subjective visual disturbances: Secondary | ICD-10-CM | POA: Diagnosis not present

## 2018-03-09 DIAGNOSIS — J4550 Severe persistent asthma, uncomplicated: Secondary | ICD-10-CM | POA: Diagnosis not present

## 2018-03-12 ENCOUNTER — Ambulatory Visit (INDEPENDENT_AMBULATORY_CARE_PROVIDER_SITE_OTHER): Payer: Medicare Other | Admitting: *Deleted

## 2018-03-12 DIAGNOSIS — J4550 Severe persistent asthma, uncomplicated: Secondary | ICD-10-CM | POA: Diagnosis not present

## 2018-03-12 MED ORDER — BENRALIZUMAB 30 MG/ML ~~LOC~~ SOSY
30.0000 mg | PREFILLED_SYRINGE | Freq: Once | SUBCUTANEOUS | Status: AC
  Administered 2018-03-12: 30 mg via SUBCUTANEOUS

## 2018-03-15 DIAGNOSIS — H2512 Age-related nuclear cataract, left eye: Secondary | ICD-10-CM | POA: Diagnosis not present

## 2018-03-15 DIAGNOSIS — H25012 Cortical age-related cataract, left eye: Secondary | ICD-10-CM | POA: Diagnosis not present

## 2018-03-15 DIAGNOSIS — H04123 Dry eye syndrome of bilateral lacrimal glands: Secondary | ICD-10-CM | POA: Diagnosis not present

## 2018-03-15 DIAGNOSIS — H5310 Unspecified subjective visual disturbances: Secondary | ICD-10-CM | POA: Diagnosis not present

## 2018-03-15 LAB — HM DIABETES EYE EXAM

## 2018-03-20 ENCOUNTER — Encounter: Payer: Self-pay | Admitting: Allergy and Immunology

## 2018-03-20 ENCOUNTER — Ambulatory Visit (INDEPENDENT_AMBULATORY_CARE_PROVIDER_SITE_OTHER): Payer: Medicare Other | Admitting: Allergy and Immunology

## 2018-03-20 VITALS — BP 136/72 | HR 78 | Resp 16

## 2018-03-20 DIAGNOSIS — J4550 Severe persistent asthma, uncomplicated: Secondary | ICD-10-CM | POA: Diagnosis not present

## 2018-03-20 DIAGNOSIS — J3089 Other allergic rhinitis: Secondary | ICD-10-CM

## 2018-03-20 DIAGNOSIS — D721 Eosinophilia, unspecified: Secondary | ICD-10-CM

## 2018-03-20 NOTE — Progress Notes (Signed)
Follow-up Note  Referring Provider: Colon Branch, MD Primary Provider: Colon Branch, MD Date of Office Visit: 03/20/2018  Subjective:   April Little (DOB: [birth year]) is a 68 y.o. female who returns to the [facility] on 03/20/2018 in re-evaluation of the following:  HPI: April Little returns to this clinic in reevaluation of her asthma treated with benralizumab and history of allergic rhinitis.  Her last visit to this clinic was 07 November 2017.  Once again she has had an excellent interval of time while using benralizumab injections.  She has completely stopped all of her controller agents and has stopped all of the medications utilized for allergic disease and has no need to use a short acting bronchodilator and can exercise without any difficulty and has not required a systemic steroid or antibiotic to treat any type of airway issue.  She did receive the flu vaccine this year.  Allergies as of 03/20/2018   No Known Allergies     Medication List      acyclovir ointment 5 % Commonly known as:  ZOVIRAX Apply 1 application topically daily as needed.   bisacodyl 5 MG EC tablet Commonly known as:  DULCOLAX Take 5 mg by mouth daily as needed for moderate constipation. Reported on 06/02/2015   EPINEPHrine 0.3 mg/0.3 mL Soaj injection Commonly known as:  EPI-PEN   fexofenadine 180 MG tablet Commonly known as:  ALLEGRA Take 180 mg by mouth daily.   fluticasone 50 MCG/ACT nasal spray Commonly known as:  FLONASE Place 2 sprays into both nostrils daily.   losartan 100 MG tablet Commonly known as:  COZAAR Take 100 mg by mouth daily.   MICROLET LANCETS Misc USE TO TEST BLOOD SUGAR SEVEN TIMES DAILY   NOVOLOG 100 UNIT/ML injection Generic drug:  insulin aspart U A MAX OF 120 UNITS QD VIA INSULIN PUMP   simvastatin 10 MG tablet Commonly known as:  ZOCOR Take 1 tablet (10 mg total) by mouth at bedtime.   VENTOLIN HFA 108 (90 Base) MCG/ACT inhaler Generic  drug:  albuterol Inhale 2 puffs into the lungs every 4 (four) hours as needed for wheezing or shortness of breath.       Past Medical History:  Diagnosis Date  . Asthma   . Diabetes type I ([place])    on a pump , f/u Dr Buddy Duty  . Hyperlipidemia   . Menopause    age 64    Past Surgical History:  Procedure Laterality Date  . BLEPHAROPLASTY    . CATARACT EXTRACTION  ~ 2016   R  . CERVICAL CONE BIOPSY     in her 72s  . KNEE ARTHROSCOPY  11-16-2011   L    Review of systems negative except as noted in HPI / PMHx or noted below:  Review of Systems  Constitutional: Negative.   HENT: Negative.   Eyes: Negative.   Respiratory: Negative.   Cardiovascular: Negative.   Gastrointestinal: Negative.   Genitourinary: Negative.   Musculoskeletal: Negative.   Skin: Negative.   Neurological: Negative.   Endo/Heme/Allergies: Negative.   Psychiatric/Behavioral: Negative.      Objective:   Vitals:   03/20/18 1512  BP: 136/72  Pulse: 78  Resp: 16  SpO2: 98%          Physical Exam Constitutional:      Appearance: She is not diaphoretic.  HENT:     Head: Normocephalic.     Right Ear: Tympanic membrane, ear canal and  external ear normal.     Left Ear: Tympanic membrane, ear canal and external ear normal.     Nose: Nose normal. No mucosal edema or rhinorrhea.     Mouth/Throat:     Pharynx: Uvula midline. No oropharyngeal exudate.  Eyes:     Conjunctiva/sclera: Conjunctivae normal.  Neck:     Thyroid: No thyromegaly.     Trachea: Trachea normal. No tracheal tenderness or tracheal deviation.  Cardiovascular:     Rate and Rhythm: Normal rate and regular rhythm.     Heart sounds: Normal heart sounds, S1 normal and S2 normal. No murmur.  Pulmonary:     Effort: No respiratory distress.     Breath sounds: Normal breath sounds. No stridor. No wheezing or rales.  Lymphadenopathy:     Head:     Right side of head: No tonsillar adenopathy.     Left side of head: No tonsillar  adenopathy.     Cervical: No cervical adenopathy.  Skin:    Findings: No erythema or rash.     Nails: There is no clubbing.   Neurological:     Mental Status: She is alert.     Diagnostics:    Spirometry was performed and demonstrated an FEV1 of 2.17 at 82 % of predicted.  The patient had an Asthma Control Test with the following results: ACT Total Score: 25.    Assessment and Plan:   1. Asthma, severe persistent, well-controlled   2. Other allergic rhinitis   3. Eosinophilia     1. Treat and prevent inflammation:   A. Benralizumab injections  2. If needed:   A. OTC antihistamine   B. nasal saline spray  C. Patanol one drop each eye two time per day  D. Proventil HFA or ProAir HFA 2 puffs every 4-6 hours if needed  3. "Action plan" for asthma flare up:   A. Flovent 220 - 2 inhalations twice a day   B. Symbicort 160 - 2 inhalations twice a day  c.  Proventil HFA or ProAir HFA 2 puffs every 4-6 hours if needed  4. Return to clinic in 12 months   April Little has had an excellent response to the use of benralizumab and it has resulted in excellent control of all of her atopic eosinophilic driven respiratory tract inflammation.  We will continue to have her use these injections and see her back in this clinic in 1 year or earlier if there is a problem.  Allena Katz, MD All[facility] / [facility] [place]

## 2018-03-20 NOTE — Patient Instructions (Signed)
  1. Treat and prevent inflammation:   A. Benralizumab injections  2. If needed:   A. OTC antihistamine   B. nasal saline spray  C. Patanol one drop each eye two time per day  D. Proventil HFA or ProAir HFA 2 puffs every 4-6 hours if needed  3. "Action plan" for asthma flare up:   A. Flovent 220 - 2 inhalations twice a day   B. Symbicort 160 - 2 inhalations twice a day  c.  Proventil HFA or ProAir HFA 2 puffs every 4-6 hours if needed  4. Return to clinic in 12 months

## 2018-03-21 ENCOUNTER — Encounter: Payer: Self-pay | Admitting: Allergy and Immunology

## 2018-04-04 DIAGNOSIS — H25812 Combined forms of age-related cataract, left eye: Secondary | ICD-10-CM | POA: Diagnosis not present

## 2018-04-04 DIAGNOSIS — H2512 Age-related nuclear cataract, left eye: Secondary | ICD-10-CM | POA: Diagnosis not present

## 2018-04-05 ENCOUNTER — Encounter: Payer: Self-pay | Admitting: Internal Medicine

## 2018-04-18 DIAGNOSIS — E1042 Type 1 diabetes mellitus with diabetic polyneuropathy: Secondary | ICD-10-CM | POA: Diagnosis not present

## 2018-04-18 DIAGNOSIS — M8580 Other specified disorders of bone density and structure, unspecified site: Secondary | ICD-10-CM | POA: Diagnosis not present

## 2018-04-18 DIAGNOSIS — Z8781 Personal history of (healed) traumatic fracture: Secondary | ICD-10-CM | POA: Diagnosis not present

## 2018-04-18 DIAGNOSIS — Z794 Long term (current) use of insulin: Secondary | ICD-10-CM | POA: Diagnosis not present

## 2018-04-18 DIAGNOSIS — E1065 Type 1 diabetes mellitus with hyperglycemia: Secondary | ICD-10-CM | POA: Diagnosis not present

## 2018-04-18 LAB — HM DIABETES FOOT EXAM

## 2018-04-30 ENCOUNTER — Encounter: Payer: Self-pay | Admitting: Internal Medicine

## 2018-05-04 DIAGNOSIS — J4550 Severe persistent asthma, uncomplicated: Secondary | ICD-10-CM | POA: Diagnosis not present

## 2018-05-07 ENCOUNTER — Ambulatory Visit (INDEPENDENT_AMBULATORY_CARE_PROVIDER_SITE_OTHER): Payer: Medicare Other | Admitting: *Deleted

## 2018-05-07 ENCOUNTER — Other Ambulatory Visit: Payer: Self-pay

## 2018-05-07 DIAGNOSIS — J4550 Severe persistent asthma, uncomplicated: Secondary | ICD-10-CM

## 2018-05-07 MED ORDER — BENRALIZUMAB 30 MG/ML ~~LOC~~ SOSY
30.0000 mg | PREFILLED_SYRINGE | Freq: Once | SUBCUTANEOUS | Status: AC
  Administered 2018-05-07: 30 mg via SUBCUTANEOUS

## 2018-06-13 ENCOUNTER — Telehealth: Payer: Self-pay

## 2018-06-13 NOTE — Telephone Encounter (Signed)
Spoke w/ Pt- due for visit. Offered virtual visit- Pt declined at this time.

## 2018-06-29 DIAGNOSIS — J4550 Severe persistent asthma, uncomplicated: Secondary | ICD-10-CM | POA: Diagnosis not present

## 2018-07-02 ENCOUNTER — Other Ambulatory Visit: Payer: Self-pay

## 2018-07-02 ENCOUNTER — Ambulatory Visit (INDEPENDENT_AMBULATORY_CARE_PROVIDER_SITE_OTHER): Payer: Medicare Other

## 2018-07-02 DIAGNOSIS — J4550 Severe persistent asthma, uncomplicated: Secondary | ICD-10-CM

## 2018-07-02 MED ORDER — BENRALIZUMAB 30 MG/ML ~~LOC~~ SOSY
30.0000 mg | PREFILLED_SYRINGE | SUBCUTANEOUS | Status: AC
  Administered 2018-07-02 – 2018-10-23 (×3): 30 mg via SUBCUTANEOUS

## 2018-07-19 DIAGNOSIS — Z8781 Personal history of (healed) traumatic fracture: Secondary | ICD-10-CM | POA: Diagnosis not present

## 2018-07-19 DIAGNOSIS — E1042 Type 1 diabetes mellitus with diabetic polyneuropathy: Secondary | ICD-10-CM | POA: Diagnosis not present

## 2018-07-19 DIAGNOSIS — E1165 Type 2 diabetes mellitus with hyperglycemia: Secondary | ICD-10-CM | POA: Diagnosis not present

## 2018-07-19 DIAGNOSIS — M8580 Other specified disorders of bone density and structure, unspecified site: Secondary | ICD-10-CM | POA: Diagnosis not present

## 2018-07-19 DIAGNOSIS — Z794 Long term (current) use of insulin: Secondary | ICD-10-CM | POA: Diagnosis not present

## 2018-07-19 LAB — TSH: TSH: 2.24 (ref 0.41–5.90)

## 2018-07-19 LAB — LIPID PANEL
Cholesterol: 169 (ref 0–200)
HDL: 102 — AB (ref 35–70)
LDL Cholesterol: 60
Triglycerides: 36 — AB (ref 40–160)

## 2018-07-19 LAB — BASIC METABOLIC PANEL
BUN: 20 (ref 4–21)
Creatinine: 0.9 (ref 0.5–1.1)
Glucose: 55
Potassium: 4.3 (ref 3.4–5.3)
Sodium: 143 (ref 137–147)

## 2018-07-19 LAB — HEPATIC FUNCTION PANEL
ALT: 22 (ref 7–35)
AST: 23 (ref 13–35)
Alkaline Phosphatase: 65 (ref 25–125)
Bilirubin, Total: 0.4

## 2018-07-19 LAB — HEMOGLOBIN A1C: Hemoglobin A1C: 7.4

## 2018-07-20 DIAGNOSIS — M79672 Pain in left foot: Secondary | ICD-10-CM | POA: Diagnosis not present

## 2018-07-20 DIAGNOSIS — M25512 Pain in left shoulder: Secondary | ICD-10-CM | POA: Diagnosis not present

## 2018-07-20 DIAGNOSIS — M79671 Pain in right foot: Secondary | ICD-10-CM | POA: Diagnosis not present

## 2018-07-20 DIAGNOSIS — M79641 Pain in right hand: Secondary | ICD-10-CM | POA: Diagnosis not present

## 2018-08-03 ENCOUNTER — Encounter: Payer: Self-pay | Admitting: Internal Medicine

## 2018-08-24 DIAGNOSIS — J4550 Severe persistent asthma, uncomplicated: Secondary | ICD-10-CM | POA: Diagnosis not present

## 2018-08-27 ENCOUNTER — Other Ambulatory Visit: Payer: Self-pay

## 2018-08-27 ENCOUNTER — Ambulatory Visit (INDEPENDENT_AMBULATORY_CARE_PROVIDER_SITE_OTHER): Payer: Medicare Other

## 2018-08-27 DIAGNOSIS — J4550 Severe persistent asthma, uncomplicated: Secondary | ICD-10-CM | POA: Diagnosis not present

## 2018-09-06 ENCOUNTER — Other Ambulatory Visit: Payer: Self-pay | Admitting: Internal Medicine

## 2018-09-10 ENCOUNTER — Encounter: Payer: Self-pay | Admitting: Internal Medicine

## 2018-09-26 ENCOUNTER — Other Ambulatory Visit: Payer: Self-pay | Admitting: Internal Medicine

## 2018-10-10 ENCOUNTER — Other Ambulatory Visit: Payer: Self-pay

## 2018-10-10 ENCOUNTER — Ambulatory Visit (INDEPENDENT_AMBULATORY_CARE_PROVIDER_SITE_OTHER): Payer: Medicare Other | Admitting: Internal Medicine

## 2018-10-10 DIAGNOSIS — I10 Essential (primary) hypertension: Secondary | ICD-10-CM | POA: Diagnosis not present

## 2018-10-10 DIAGNOSIS — E785 Hyperlipidemia, unspecified: Secondary | ICD-10-CM | POA: Diagnosis not present

## 2018-10-10 DIAGNOSIS — M722 Plantar fascial fibromatosis: Secondary | ICD-10-CM | POA: Diagnosis not present

## 2018-10-10 MED ORDER — SIMVASTATIN 10 MG PO TABS: 10.0000 mg | ORAL_TABLET | Freq: Every day | ORAL | 0 refills | Status: DC

## 2018-10-10 MED ORDER — LOSARTAN POTASSIUM 100 MG PO TABS: 100.0000 mg | ORAL_TABLET | Freq: Every day | ORAL | 0 refills | Status: AC

## 2018-10-10 NOTE — Progress Notes (Signed)
Subjective:    Patient ID: April Little, female    DOB: Dec 23, 1950, 68 y.o.   MRN: [id number]  DOS:  10/10/2018 Type of visit - description: Virtual Visit via Video Note  I connected with@   by a video enabled telemedicine application and verified that I am speaking with the correct person using two identifiers.   THIS ENCOUNTER IS A VIRTUAL VISIT DUE TO COVID-19 - PATIENT WAS NOT SEEN IN THE OFFICE. PATIENT HAS CONSENTED TO VIRTUAL VISIT / TELEMEDICINE VISIT   Location of patient: home  Location of provider: office  I discussed the limitations of evaluation and management by telemedicine and the availability of in person appointments. The patient expressed understanding and agreed to proceed.  History of Present Illness: Acute visit The patient moved out of state to [place]. Needs refill on her BP and cholesterol medication. No recent ambulatory BPs.  She is also having problems with plantar fasciitis.     Review of Systems Denies chest pain no difficulty breathing No nausea, vomiting, diarrhea. Doing well other than the problems with plantar fascia  Past Medical History:  Diagnosis Date  . Asthma   . Diabetes type I ([place])    on a pump , f/u Dr Buddy Duty  . Hyperlipidemia   . Menopause    age 60    Past Surgical History:  Procedure Laterality Date  . BLEPHAROPLASTY    . CATARACT EXTRACTION  ~ 2016   R  . CERVICAL CONE BIOPSY     in her 40s  . KNEE ARTHROSCOPY  11-16-2011   L    Social History   Socioeconomic History  . Marital status: Married    Spouse name: Not on file  . Number of children: 2  . Years of education: Not on file  . Highest education level: Not on file  Occupational History  . Occupation: stay home mom  Social Needs  . Financial resource strain: Not on file  . Food insecurity    Worry: Not on file    Inability: Not on file  . Transportation needs    Medical: Not on file    Non-medical: Not on file  Tobacco Use  . Smoking  status: Former Smoker    Packs/day: 3.00    Years: 12.00    Pack years: 36.00    Types: Cigarettes    Quit date: 02/15/1979    Years since quitting: 39.6  . Smokeless tobacco: Never Used  . Tobacco comment: quit 1981  Substance and Sexual Activity  . Alcohol use: Yes    Alcohol/week: 0.0 standard drinks    Comment: occasional  . Drug use: No  . Sexual activity: Never  Lifestyle  . Physical activity    Days per week: Not on file    Minutes per session: Not on file  . Stress: Not on file  Relationships  . Social Herbalist on phone: Not on file    Gets together: Not on file    Attends religious service: Not on file    Active member of club or organization: Not on file    Attends meetings of clubs or organizations: Not on file    Relationship status: Not on file  . Intimate partner violence    Fear of current or ex partner: Not on file    Emotionally abused: Not on file    Physically abused: Not on file    Forced sexual activity: Not on file  Other  Topics Concern  . Not on file  Social History Narrative   Plays tennis ; has a disable son that depends on her       Allergies as of 10/10/2018   No Known Allergies     Medication List       Accurate as of October 10, 2018  3:47 PM. If you have any questions, ask your nurse or doctor.        acyclovir ointment 5 % Commonly known as: Zovirax Apply 1 application topically daily as needed.   bisacodyl 5 MG EC tablet Commonly known as: DULCOLAX Take 5 mg by mouth daily as needed for moderate constipation. Reported on 06/02/2015   EPINEPHrine 0.3 mg/0.3 mL Soaj injection Commonly known as: EPI-PEN   fexofenadine 180 MG tablet Commonly known as: ALLEGRA Take 180 mg by mouth daily.   fluticasone 50 MCG/ACT nasal spray Commonly known as: FLONASE Place 2 sprays into both nostrils daily.   losartan 100 MG tablet Commonly known as: COZAAR Take 100 mg by mouth daily.   Microlet Lancets Misc USE TO TEST BLOOD  SUGAR SEVEN TIMES DAILY   NovoLOG 100 UNIT/ML injection Generic drug: insulin aspart U A MAX OF 120 UNITS QD VIA INSULIN PUMP   simvastatin 10 MG tablet Commonly known as: ZOCOR Take 1 tablet (10 mg total) by mouth at bedtime.   Ventolin HFA 108 (90 Base) MCG/ACT inhaler Generic drug: albuterol Inhale 2 puffs into the lungs every 4 (four) hours as needed for wheezing or shortness of breath.           Objective:   Physical Exam There were no vitals taken for this visit. This is a virtual video visit, after a few minutes we had to switch to telephone due to poor audio quality.     Assessment      Assessment DM type I Dr. Buddy Duty HTN Hyperlipidemia Asthma Osteopenia: T score -1.5 (05-2009), had a DEXA 06/2015, T score -2.2 Menopause , age 33 + [place] CAD  PLAN Chart reviewed, had labs 07/2018: BMP and cholesterol normal. HTN: Currently on losartan, will send a 21-month supply Hyperlipidemia: Well-controlled, on simvastatin, will send a 107-month supply Plantar fasciitis: Stretching, icing good arch support (tennis shoes at home rather than barefoot) recommended Care coordination: She now lives in [place], recommend to get established with PCP within the next 3 months. RTC PRN

## 2018-10-11 NOTE — Assessment & Plan Note (Signed)
Chart reviewed, had labs 07/2018: BMP and cholesterol normal. HTN: Currently on losartan, will send a 58-month supply Hyperlipidemia: Well-controlled, on simvastatin, will send a 22-month supply Plantar fasciitis: Stretching, icing good arch support (tennis shoes at home rather than barefoot) recommended Care coordination: She now lives in [place], recommend to get established with PCP within the next 3 months. RTC PRN

## 2018-10-19 DIAGNOSIS — J4550 Severe persistent asthma, uncomplicated: Secondary | ICD-10-CM | POA: Diagnosis not present

## 2018-10-21 ENCOUNTER — Other Ambulatory Visit: Payer: Self-pay | Admitting: Internal Medicine

## 2018-10-23 ENCOUNTER — Other Ambulatory Visit: Payer: Self-pay

## 2018-10-23 ENCOUNTER — Ambulatory Visit (INDEPENDENT_AMBULATORY_CARE_PROVIDER_SITE_OTHER): Payer: Medicare Other | Admitting: *Deleted

## 2018-10-23 DIAGNOSIS — E1165 Type 2 diabetes mellitus with hyperglycemia: Secondary | ICD-10-CM | POA: Diagnosis not present

## 2018-10-23 DIAGNOSIS — J4550 Severe persistent asthma, uncomplicated: Secondary | ICD-10-CM | POA: Diagnosis not present

## 2018-10-23 DIAGNOSIS — Z794 Long term (current) use of insulin: Secondary | ICD-10-CM | POA: Diagnosis not present

## 2018-10-23 DIAGNOSIS — E1042 Type 1 diabetes mellitus with diabetic polyneuropathy: Secondary | ICD-10-CM | POA: Diagnosis not present

## 2018-10-23 LAB — MICROALBUMIN, URINE: Microalb, Ur: <0.7

## 2018-10-23 LAB — HEMOGLOBIN A1C: Hemoglobin A1C: 7.5

## 2018-10-23 LAB — HM DIABETES FOOT EXAM: HM Diabetic Foot Exam: NORMAL

## 2018-11-01 ENCOUNTER — Encounter: Payer: Self-pay | Admitting: Internal Medicine

## 2018-11-06 ENCOUNTER — Telehealth: Payer: Self-pay

## 2018-11-06 NOTE — Telephone Encounter (Signed)
Patient is needing a letter for a transfer of care to [facility].  Patient is requesting a medical release form, I will send this to her via email.    Please Advise on the letter.

## 2018-11-07 NOTE — Telephone Encounter (Signed)
I left a message for the patient advising her that there is no need for a letter for transfer of care. I told her in the message that we would just need the medical release form signed and sent back so that we could get her records transferred.

## 2018-12-05 DIAGNOSIS — I10 Essential (primary) hypertension: Secondary | ICD-10-CM | POA: Diagnosis not present

## 2018-12-05 DIAGNOSIS — M6530 Trigger finger, unspecified finger: Secondary | ICD-10-CM | POA: Diagnosis not present

## 2018-12-05 DIAGNOSIS — E119 Type 2 diabetes mellitus without complications: Secondary | ICD-10-CM | POA: Diagnosis not present

## 2018-12-05 DIAGNOSIS — E785 Hyperlipidemia, unspecified: Secondary | ICD-10-CM | POA: Diagnosis not present

## 2018-12-05 DIAGNOSIS — Z23 Encounter for immunization: Secondary | ICD-10-CM | POA: Diagnosis not present

## 2018-12-18 ENCOUNTER — Ambulatory Visit: Payer: Self-pay

## 2018-12-21 DIAGNOSIS — E785 Hyperlipidemia, unspecified: Secondary | ICD-10-CM | POA: Diagnosis not present

## 2018-12-21 DIAGNOSIS — E109 Type 1 diabetes mellitus without complications: Secondary | ICD-10-CM | POA: Diagnosis not present

## 2018-12-21 DIAGNOSIS — J45909 Unspecified asthma, uncomplicated: Secondary | ICD-10-CM | POA: Diagnosis not present

## 2018-12-21 DIAGNOSIS — I10 Essential (primary) hypertension: Secondary | ICD-10-CM | POA: Diagnosis not present

## 2019-01-03 DIAGNOSIS — M65331 Trigger finger, right middle finger: Secondary | ICD-10-CM | POA: Diagnosis not present

## 2019-01-03 DIAGNOSIS — M6530 Trigger finger, unspecified finger: Secondary | ICD-10-CM | POA: Diagnosis not present

## 2019-01-03 DIAGNOSIS — R52 Pain, unspecified: Secondary | ICD-10-CM | POA: Diagnosis not present

## 2019-01-04 ENCOUNTER — Other Ambulatory Visit: Payer: Self-pay

## 2019-01-22 DIAGNOSIS — M8580 Other specified disorders of bone density and structure, unspecified site: Secondary | ICD-10-CM | POA: Diagnosis not present

## 2019-01-22 DIAGNOSIS — Z8781 Personal history of (healed) traumatic fracture: Secondary | ICD-10-CM | POA: Diagnosis not present

## 2019-01-22 DIAGNOSIS — Z794 Long term (current) use of insulin: Secondary | ICD-10-CM | POA: Diagnosis not present

## 2019-01-22 DIAGNOSIS — E1042 Type 1 diabetes mellitus with diabetic polyneuropathy: Secondary | ICD-10-CM | POA: Diagnosis not present

## 2019-01-22 DIAGNOSIS — Z9641 Presence of insulin pump (external) (internal): Secondary | ICD-10-CM | POA: Diagnosis not present

## 2019-03-05 DIAGNOSIS — Z794 Long term (current) use of insulin: Secondary | ICD-10-CM | POA: Diagnosis not present

## 2019-03-05 DIAGNOSIS — E1042 Type 1 diabetes mellitus with diabetic polyneuropathy: Secondary | ICD-10-CM | POA: Diagnosis not present

## 2019-03-05 DIAGNOSIS — M8580 Other specified disorders of bone density and structure, unspecified site: Secondary | ICD-10-CM | POA: Diagnosis not present

## 2019-03-05 DIAGNOSIS — Z9641 Presence of insulin pump (external) (internal): Secondary | ICD-10-CM | POA: Diagnosis not present

## 2019-03-13 DIAGNOSIS — H43812 Vitreous degeneration, left eye: Secondary | ICD-10-CM | POA: Diagnosis not present

## 2019-03-26 DIAGNOSIS — Z1231 Encounter for screening mammogram for malignant neoplasm of breast: Secondary | ICD-10-CM | POA: Diagnosis not present

## 2019-04-11 DIAGNOSIS — J3089 Other allergic rhinitis: Secondary | ICD-10-CM | POA: Diagnosis not present

## 2019-04-11 DIAGNOSIS — J8283 Eosinophilic asthma: Secondary | ICD-10-CM | POA: Diagnosis not present

## 2019-04-11 DIAGNOSIS — J3489 Other specified disorders of nose and nasal sinuses: Secondary | ICD-10-CM | POA: Diagnosis not present

## 2019-04-11 IMAGING — DX DG HAND COMPLETE 3+V*R*
3 series · 3 of 3 positions shown · non-contrast
Comparison: None.

CLINICAL DATA: Follow-up pain for 2 weeks, no known injury, initial
encounter

EXAM:
RIGHT HAND - COMPLETE 3+ VIEW

[hand pa]
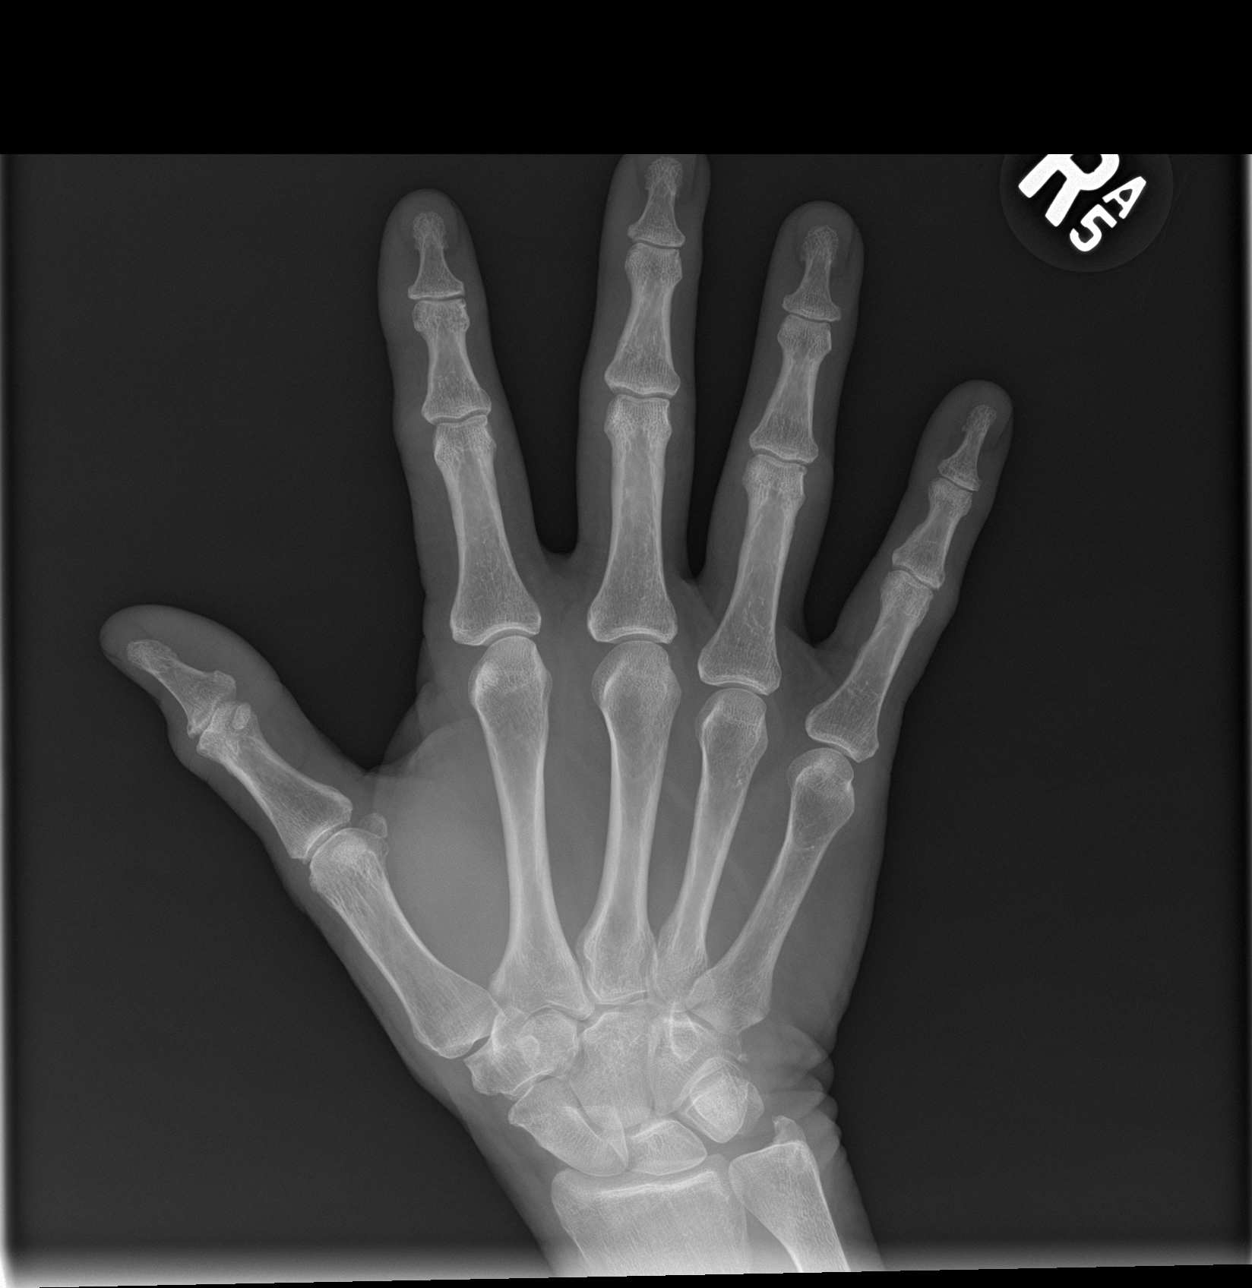

[hand obl]
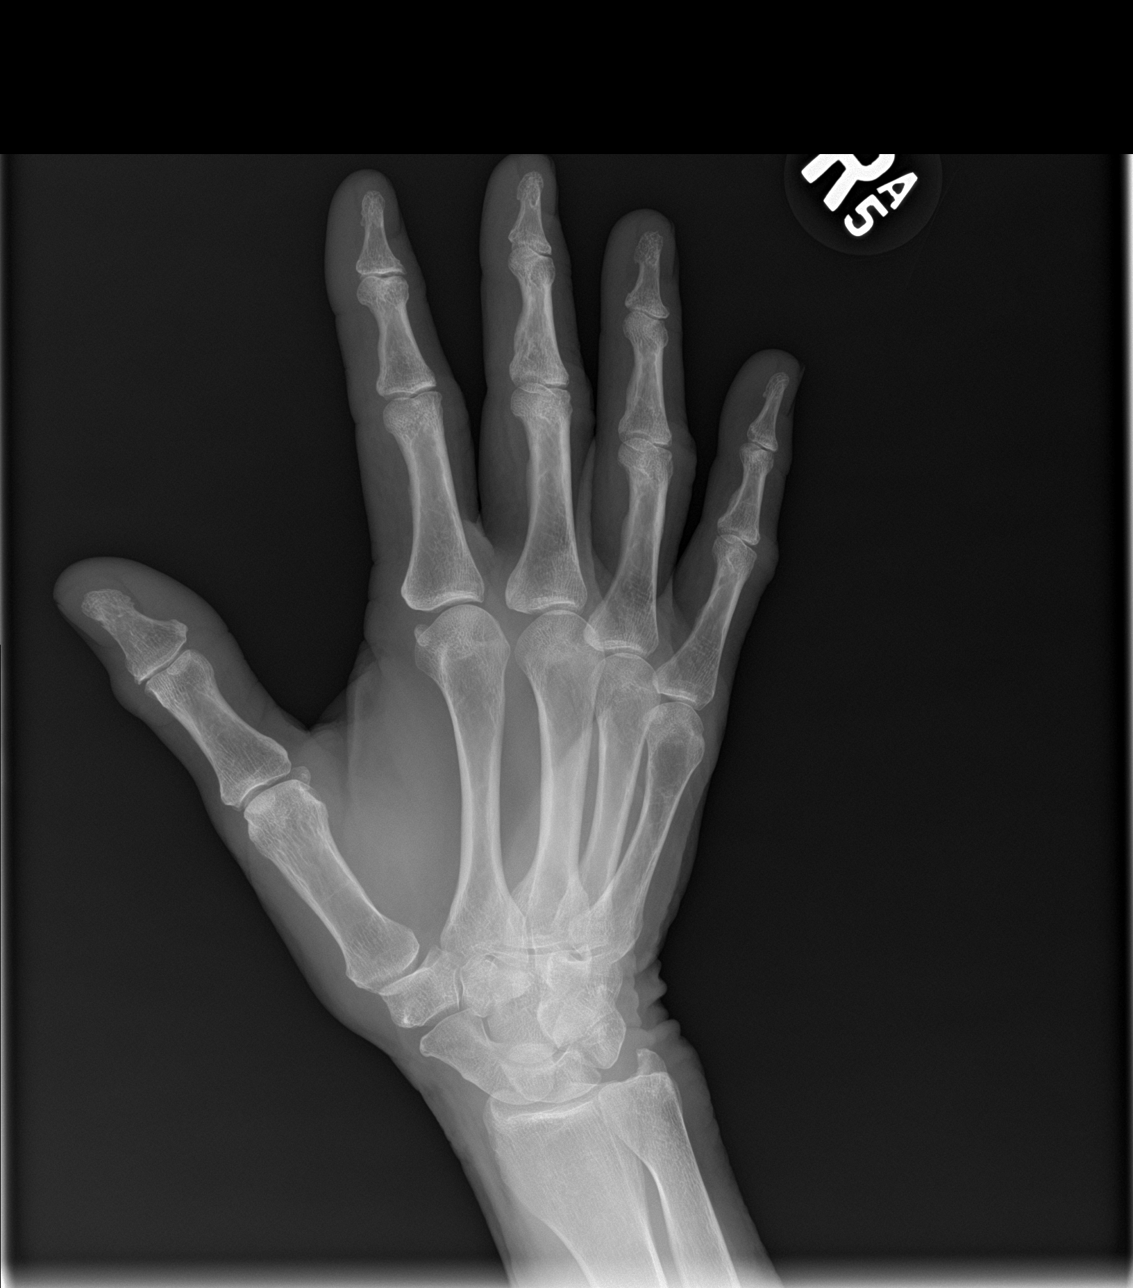

[hand lat]
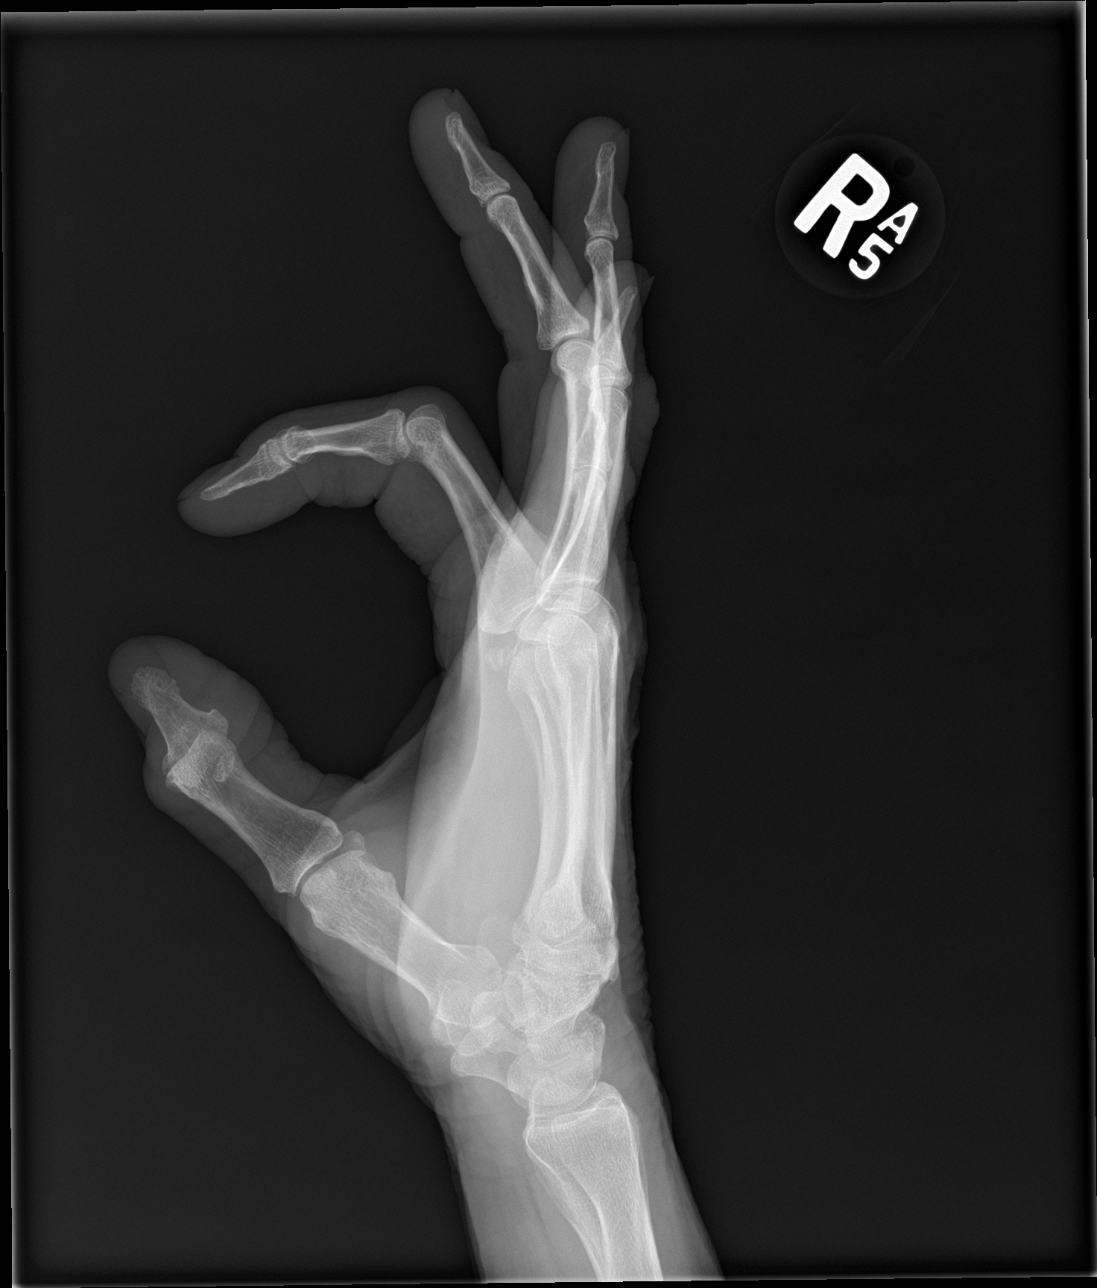

[3 of 3 positions shown; findings below may reference images not displayed]

FINDINGS: Very mild degenerative changes are noted at the MCP joint and
interphalangeal joint of the first digit. No acute fracture or
dislocation is seen. No soft tissue abnormality is noted.
IMPRESSION: Mild degenerative change as described.

## 2019-11-01 DIAGNOSIS — J4550 Severe persistent asthma, uncomplicated: Secondary | ICD-10-CM

## 2019-11-04 ENCOUNTER — Encounter: Payer: Self-pay | Admitting: Allergy and Immunology

## 2019-11-04 ENCOUNTER — Ambulatory Visit (INDEPENDENT_AMBULATORY_CARE_PROVIDER_SITE_OTHER): Payer: Medicare Other | Admitting: Allergy and Immunology

## 2019-11-04 VITALS — BP 140/82 | HR 87 | Resp 18 | Ht 67.0 in | Wt 176.8 lb

## 2019-11-04 DIAGNOSIS — D7219 Other eosinophilia: Secondary | ICD-10-CM

## 2019-11-04 DIAGNOSIS — J4550 Severe persistent asthma, uncomplicated: Secondary | ICD-10-CM

## 2019-11-04 DIAGNOSIS — J3089 Other allergic rhinitis: Secondary | ICD-10-CM

## 2019-11-04 MED ORDER — BENRALIZUMAB 30 MG/ML ~~LOC~~ SOSY
30.0000 mg | PREFILLED_SYRINGE | Freq: Once | SUBCUTANEOUS | Status: AC
  Administered 2019-11-04: 30 mg via SUBCUTANEOUS

## 2019-11-04 NOTE — Progress Notes (Signed)
- [place]   Follow-up Note   Referring Provider: Colon Branch, MD Primary Provider: Colon Branch, MD Date of Office Visit: 11/04/2019  Subjective:   April Little (DOB: [birth year]) is a 69 y.o. 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  I have not seen April Little in this clinic since 20 March 2018.  April Little has had an excellent response to the administration of benralizumab with almost complete elimination of April Little symptomatic asthma and no need for systemic steroid or antibiotic to address any type of airway issue and rare use of a short acting bronchodilator and no requirement for controller agents while utilizing benralizumab.  Unfortunately, there was a logistical issue obtaining benralizumab in that April Little moved to [place] about 1 year ago and April Little has been without this medication and April Little can definitely tell with recurrent wheezing and coughing and using April Little bronchodilator on a daily basis and inability to exercise.  April Little has attempted to obtain benralizumab from a local asthma specialist in [place] but for some reason there is been a Neurosurgeon to get that completed.  April Little has obtained two Pfizer Covid vaccines.  Allergies as of 11/04/2019   No Known Allergies     Medication List      acyclovir ointment 5 % Commonly known as: Zovirax Apply 1 application topically daily as needed.   bisacodyl 5 MG EC tablet Commonly known as: DULCOLAX Take 5 mg by mouth daily as needed for moderate constipation. Reported on 06/02/2015   EPINEPHrine 0.3 mg/0.3 mL Soaj injection Commonly known as: EPI-PEN   fexofenadine 180 MG tablet Commonly known as: ALLEGRA Take 180 mg by mouth daily.   fluticasone 50 MCG/ACT nasal spray Commonly known as:  FLONASE Place 2 sprays into both nostrils daily.   losartan 100 MG tablet Commonly known as: COZAAR Take 1 tablet (100 mg total) by mouth daily.   Microlet Lancets Misc USE TO TEST BLOOD SUGAR SEVEN TIMES DAILY   NovoLOG 100 UNIT/ML injection Generic drug: insulin aspart U A MAX OF 120 UNITS QD VIA INSULIN PUMP   simvastatin 10 MG tablet Commonly known as: ZOCOR Take 1 tablet (10 mg total) by mouth at bedtime.   Ventolin HFA 108 (90 Base) MCG/ACT inhaler Generic drug: albuterol Inhale 2 puffs into the lungs every 4 (four) hours as needed for wheezing or shortness of breath.       Past Medical History:  Diagnosis Date  . Asthma   . Diabetes type I ([place])    on a pump , f/u Dr Buddy Duty  . Hyperlipidemia   . Menopause    age 34    Past Surgical History:  Procedure Laterality Date  . BLEPHAROPLASTY    . CATARACT EXTRACTION  ~ 2016   R  . CERVICAL CONE BIOPSY     in April Little 58s  . KNEE ARTHROSCOPY  11-16-2011   L    Review of systems negative except as noted in HPI / PMHx or noted below:  Review of Systems  Constitutional: Negative.   HENT: Negative.   Eyes: Negative.   Respiratory: Negative.   Cardiovascular: Negative.   Gastrointestinal: Negative.   Genitourinary: Negative.   Musculoskeletal: Negative.   Skin: Negative.   Neurological: Negative.   Endo/Heme/Allergies: Negative.   Psychiatric/Behavioral:  Negative.      Objective:   Vitals:   11/04/19 1348  BP: 140/82  Pulse: 87  Resp: 18  SpO2: 96%   Height: 5\' 7"  (170.2 cm)  Weight: 176 lb 12.8 oz (80.2 kg)   Physical Exam Constitutional:      Appearance: April Little is not diaphoretic.  HENT:     Head: Normocephalic.     Right Ear: Tympanic membrane, ear canal and external ear normal.     Left Ear: Tympanic membrane, ear canal and external ear normal.     Nose: Nose normal. No mucosal edema or rhinorrhea.     Mouth/Throat:     Pharynx: Uvula midline. No oropharyngeal exudate.  Eyes:      Conjunctiva/sclera: Conjunctivae normal.  Neck:     Thyroid: No thyromegaly.     Trachea: Trachea normal. No tracheal tenderness or tracheal deviation.  Cardiovascular:     Rate and Rhythm: Normal rate and regular rhythm.     Heart sounds: Normal heart sounds, S1 normal and S2 normal. No murmur heard.   Pulmonary:     Effort: No respiratory distress.     Breath sounds: Normal breath sounds. No stridor. No wheezing or rales.  Lymphadenopathy:     Head:     Right side of head: No tonsillar adenopathy.     Left side of head: No tonsillar adenopathy.     Cervical: No cervical adenopathy.  Skin:    Findings: No erythema or rash.     Nails: There is no clubbing.  Neurological:     Mental Status: April Little is alert.     Diagnostics:    Spirometry was performed and demonstrated an FEV1 of 1.25 at 48 % of predicted.  Assessment and Plan:   1. Not well controlled severe persistent asthma   2. Other allergic rhinitis   3. Other eosinophilia     1. Restart Benralizumab injection with dose today and arrange for autoinjector use  2. If needed:   A. OTC antihistamine   B. nasal saline spray  C. Patanol one drop each eye two time per day  D. Proventil HFA or ProAir HFA 2 puffs every 4-6 hours if needed  3. Obtain fall flu vaccine and Covid booster  4. Return to clinic in 12 months or earlier if problem   April Little has had an excellent response to the use of benralizumab regarding April Little longstanding severe eosinophilic driven asthma and I think April Little needs to restart this medication.  We will arrange for April Little to get benralizumab autoinjector use as April Little is now living in [place].  We will need to see April Little back in this clinic every year while April Little continues on this form of treatment.  Allena Katz, MD Allergy / Immunology [place]

## 2019-11-04 NOTE — Patient Instructions (Addendum)
°  1. Restart Benralizumab injection with dose today and arrange for autoinjector use  2. If needed:   A. OTC antihistamine   B. nasal saline spray  C. Patanol one drop each eye two time per day  D. Proventil HFA or ProAir HFA 2 puffs every 4-6 hours if needed  3. Obtain fall flu vaccine and Covid booster  4. Return to clinic in 12 months or earlier if problem

## 2019-11-04 NOTE — Progress Notes (Signed)
Patient was restarted on Fasenra today in office. Patient still has epipen, and understands when she should use it. Patient didn't experience any negative side effects while in office.

## 2019-11-05 ENCOUNTER — Encounter: Payer: Self-pay | Admitting: Allergy and Immunology

## 2020-12-17 ENCOUNTER — Other Ambulatory Visit: Payer: Self-pay | Admitting: *Deleted

## 2020-12-17 MED ORDER — FASENRA PEN 30 MG/ML ~~LOC~~ SOAJ: 30.0000 mg | SUBCUTANEOUS | 6 refills | Status: DC

## 2021-12-22 ENCOUNTER — Other Ambulatory Visit: Payer: Self-pay | Admitting: *Deleted

## 2021-12-22 MED ORDER — FASENRA PEN 30 MG/ML ~~LOC~~ SOAJ: 30.0000 mg | SUBCUTANEOUS | 6 refills | Status: DC

## 2022-05-25 ENCOUNTER — Encounter: Payer: Self-pay | Admitting: Gastroenterology

## 2023-05-01 ENCOUNTER — Telehealth: Payer: Self-pay | Admitting: *Deleted

## 2023-05-01 NOTE — Telephone Encounter (Signed)
 Patient called in regards to [facility] script. I called and l/m for patient needs MD appt since it has been almost 4 years since last visit

## 2023-12-21 ENCOUNTER — Ambulatory Visit: Admitting: Allergy and Immunology

## 2023-12-28 ENCOUNTER — Encounter: Payer: Self-pay | Admitting: Allergy and Immunology

## 2023-12-28 ENCOUNTER — Ambulatory Visit: Admitting: Allergy and Immunology

## 2023-12-28 VITALS — BP 124/72 | HR 96 | Resp 16 | Ht 67.0 in | Wt 140.2 lb

## 2023-12-28 DIAGNOSIS — J3089 Other allergic rhinitis: Secondary | ICD-10-CM

## 2023-12-28 DIAGNOSIS — J4550 Severe persistent asthma, uncomplicated: Secondary | ICD-10-CM | POA: Diagnosis not present

## 2023-12-28 MED ORDER — METHYLPREDNISOLONE ACETATE 80 MG/ML IJ SUSP
80.0000 mg | Freq: Once | INTRAMUSCULAR | Status: AC
  Administered 2023-12-28: 80 mg via INTRAMUSCULAR

## 2023-12-28 MED ORDER — BREZTRI AEROSPHERE 160-9-4.8 MCG/ACT IN AERO: 2.0000 | INHALATION_SPRAY | Freq: Two times a day (BID) | RESPIRATORY_TRACT | 5 refills | Status: DC

## 2023-12-28 MED ORDER — AIRSUPRA 90-80 MCG/ACT IN AERO: 2.0000 | INHALATION_SPRAY | RESPIRATORY_TRACT | 2 refills | Status: DC | PRN

## 2023-12-28 NOTE — Progress Notes (Signed)
 [facility] [place] [facility] [place] [facility] [place] [facility] [place]   Follow-up Note  Referring Provider: No ref. provider found Primary Provider: Patient, No Pcp Per Date of Office Visit: 12/28/2023  Subjective:   April Little (DOB: 1950/07/23) is a 73 y.o. female who [facility] in re-evaluation of the following:  HPI: Latifah returns to this clinic in evaluation of eosinophilic asthma.  I last saw her in this clinic 04 November 2019.  For the past several years she has been using her benralizumab  injections to control her eosinophilic disease which has worked proofreader.  While utilizing this medication she rarely had any significant problems with her asthma and could exercise by playing tennis with no problem and did not require systemic steroid to treat an exacerbation.  And her upper airway disease was also under better control with some intermittent use of nasal steroids.  However, because she is living in [place], there was a logistical block for her to receive her benralizumab  injections starting in 2025.  And ever since the late summer has arrived she has been having recurrent issues with wheezing and coughing and using her bronchodilator multiple times per day and disturbance of sleep secondary to coughing and some decreased exercise capacity.  Allergies as of 12/28/2023   No Known Allergies      Medication List    acyclovir  ointment 5 % Commonly known as: Zovirax  Apply 1 application topically daily as needed.   alendronate 70 MG tablet Commonly known as: FOSAMAX Take 70 mg by mouth once a week.   azelastine 0.1 % nasal spray Commonly known as: ASTELIN Place into the nose.   Fasenra  Pen 30 MG/ML prefilled autoinjector Generic drug: benralizumab  Inject 1 mL (30 mg total) into the skin every 8 (eight) weeks.   fluticasone  50 MCG/ACT nasal spray Commonly known as: FLONASE  Place 2 sprays into both nostrils daily.    losartan  100 MG tablet Commonly known as: COZAAR  Take 1 tablet (100 mg total) by mouth daily.   Microlet Lancets Misc USE TO TEST BLOOD SUGAR SEVEN TIMES DAILY   NovoLOG 100 UNIT/ML injection Generic drug: insulin  aspart U A MAX OF 120 UNITS QD VIA INSULIN  PUMP   Ozempic (2 MG/DOSE) 8 MG/3ML Sopn Generic drug: Semaglutide (2 MG/DOSE) INJECT 2MG  BENEATH THE SKIN EVERY 7 DAYS.   simvastatin  10 MG tablet Commonly known as: ZOCOR  Take 1 tablet (10 mg total) by mouth at bedtime.   Ventolin  HFA 108 (90 Base) MCG/ACT inhaler Generic drug: albuterol  Inhale 2 puffs into the lungs every 4 (four) hours as needed for wheezing or shortness of breath.    Past Medical History:  Diagnosis Date   Asthma    Diabetes type I (HCC)    on a pump , f/u Dr Faythe   Hyperlipidemia    Menopause    age 89    Past Surgical History:  Procedure Laterality Date   BLEPHAROPLASTY     CATARACT EXTRACTION  ~ 2016   R   CERVICAL CONE BIOPSY     in her 29s   KNEE ARTHROSCOPY  11-16-2011   L    Review of systems negative except as noted in HPI / PMHx or noted below:  Review of Systems  Constitutional: Negative.   HENT: Negative.    Eyes: Negative.   Respiratory: Negative.    Cardiovascular: Negative.   Gastrointestinal: Negative.   Genitourinary: Negative.   Musculoskeletal: Negative.   Skin: Negative.  Neurological: Negative.   Endo/Heme/Allergies: Negative.   Psychiatric/Behavioral: Negative.       Objective:   Vitals:   12/28/23 1604  BP: 124/72  Pulse: 96  Resp: 16  SpO2: 97%   Height: 5' 7 (170.2 cm)  Weight: 140 lb 3.2 oz (63.6 kg)   Physical Exam Constitutional:      Appearance: She is not diaphoretic.  HENT:     Head: Normocephalic.     Right Ear: Tympanic membrane, ear canal and external ear normal.     Left Ear: Tympanic membrane, ear canal and external ear normal.     Nose: Nose normal. No mucosal edema or rhinorrhea.     Mouth/Throat:     Pharynx: Uvula  midline. No oropharyngeal exudate.  Eyes:     Conjunctiva/sclera: Conjunctivae normal.  Neck:     Thyroid : No thyromegaly.     Trachea: Trachea normal. No tracheal tenderness or tracheal deviation.  Cardiovascular:     Rate and Rhythm: Normal rate and regular rhythm.     Heart sounds: Normal heart sounds, S1 normal and S2 normal. No murmur heard. Pulmonary:     Effort: No respiratory distress.     Breath sounds: No stridor. Wheezing (Bilateral expiratory wheezes all lung fields) present. No rales.  Lymphadenopathy:     Head:     Right side of head: No tonsillar adenopathy.     Left side of head: No tonsillar adenopathy.     Cervical: No cervical adenopathy.  Skin:    Findings: No erythema or rash.     Nails: There is no clubbing.  Neurological:     Mental Status: She is alert.     Diagnostics: Spirometry was performed and demonstrated an FEV1 of 1.53 at 66 % of predicted.  Assessment and Plan:   1. Not well controlled severe persistent asthma (HCC)   2. Perennial allergic rhinitis     1. Restart Benralizumab  injection after Tammy approval  2. Depomedrol 80 IM delivered in clinic today (blood sugars)  3. Start Breztri - 2 inhalations 2 times per day (empty lungs)  4. If needed:   A. OTC antihistamine   B. Flonase   C. Airsupra -  2 puffs every 4-6 hours (replaces albuterol )  5. Blood today - CBC w/d  6. Return to clinic in 12 months or earlier if problem  7. Influenza = Tamiflu. Covid = Paxloviid  Shaquanda has a long history of asthma that never came under good control until we started her on anti-IL-5 receptor biologic agent and we need to restart that agent once again.  For now we will give her a systemic steroid with the understanding that she will need to monitor her blood glucose, and give her a triple inhaler, and get insurance approval for her benralizumab .  We will check a CBC with differential to look at her eosinophil level today.  Will hopefully get  everything arranged within the next few weeks so that she can receive her anti-IL-5 receptor biologic agent in [place] .  Camellia Denis, MD Allergy / Immunology [facility]

## 2023-12-28 NOTE — Patient Instructions (Addendum)
  1. Restart Benralizumab  injection after Tammy approval  2. Depomedrol 80 IM delivered in clinic today (blood sugars)  3. Start Breztri - 2 inhalations 2 times per day (empty lungs)  4. If needed:   A. OTC antihistamine   B. Flonase   C. Airsupra -  2 puffs every 4-6 hours (replaces albuterol )  5. Blood today - CBC w/d  6. Return to clinic in 12 months or earlier if problem  7. Influenza = Tamiflu. Covid = Paxloviid

## 2023-12-29 ENCOUNTER — Encounter: Payer: Self-pay | Admitting: Allergy and Immunology

## 2023-12-29 LAB — CBC WITH DIFF/PLATELET
Basophils Absolute: 0.1 x10E3/uL (ref 0.0–0.2)
Basos: 1 %
EOS (ABSOLUTE): 0.6 x10E3/uL — ABNORMAL HIGH (ref 0.0–0.4)
Eos: 7 %
Hematocrit: 37.9 % (ref 34.0–46.6)
Hemoglobin: 12.2 g/dL (ref 11.1–15.9)
Immature Grans (Abs): 0 x10E3/uL (ref 0.0–0.1)
Immature Granulocytes: 0 %
Lymphocytes Absolute: 1.9 x10E3/uL (ref 0.7–3.1)
Lymphs: 20 %
MCH: 28.6 pg (ref 26.6–33.0)
MCHC: 32.2 g/dL (ref 31.5–35.7)
MCV: 89 fL (ref 79–97)
Monocytes Absolute: 0.7 x10E3/uL (ref 0.1–0.9)
Monocytes: 8 %
Neutrophils Absolute: 5.9 x10E3/uL (ref 1.4–7.0)
Neutrophils: 64 %
Platelets: 261 x10E3/uL (ref 150–450)
RBC: 4.26 x10E6/uL (ref 3.77–5.28)
RDW: 12.7 % (ref 11.7–15.4)
WBC: 9.3 x10E3/uL (ref 3.4–10.8)

## 2024-01-01 ENCOUNTER — Encounter: Payer: Self-pay | Admitting: Allergy and Immunology

## 2024-01-01 ENCOUNTER — Other Ambulatory Visit: Payer: Self-pay

## 2024-01-01 ENCOUNTER — Ambulatory Visit: Payer: Self-pay | Admitting: Allergy and Immunology

## 2024-01-01 MED ORDER — BREZTRI AEROSPHERE 160-9-4.8 MCG/ACT IN AERO: 2.0000 | INHALATION_SPRAY | Freq: Two times a day (BID) | RESPIRATORY_TRACT | 5 refills | Status: AC

## 2024-01-01 MED ORDER — AIRSUPRA 90-80 MCG/ACT IN AERO: 2.0000 | INHALATION_SPRAY | RESPIRATORY_TRACT | 2 refills | Status: AC | PRN

## 2024-01-03 ENCOUNTER — Telehealth: Payer: Self-pay | Admitting: *Deleted

## 2024-01-03 MED ORDER — FASENRA PEN 30 MG/ML ~~LOC~~ SOAJ: 30.0000 mg | SUBCUTANEOUS | 6 refills | Status: DC

## 2024-01-03 NOTE — Telephone Encounter (Signed)
 Patient Ins denied approval initially for Fasenra  due to patient on PAP which she has not been on in 2 years. Will do test claim then reach out to Ins with withdraw from PAP to see if that will help

## 2024-01-03 NOTE — Telephone Encounter (Signed)
-----   Message from [name] sent at 01/01/2024  6:26 AM EST ----- April Little

## 2024-01-04 ENCOUNTER — Other Ambulatory Visit (HOSPITAL_COMMUNITY): Payer: Self-pay

## 2024-01-10 NOTE — Telephone Encounter (Signed)
 Spoke to patient and emailed pap application for her to return to me

## 2024-01-17 ENCOUNTER — Telehealth: Payer: Self-pay | Admitting: *Deleted

## 2024-01-17 ENCOUNTER — Other Ambulatory Visit (HOSPITAL_COMMUNITY): Payer: Self-pay

## 2024-01-17 MED ORDER — FASENRA PEN 30 MG/ML ~~LOC~~ SOAJ: 30.0000 mg | SUBCUTANEOUS | 9 refills | Status: DC

## 2024-01-29 NOTE — Telephone Encounter (Signed)
 error

## 2024-02-26 ENCOUNTER — Other Ambulatory Visit: Payer: Self-pay | Admitting: *Deleted

## 2024-02-26 MED ORDER — FASENRA PEN 30 MG/ML ~~LOC~~ SOAJ: 30.0000 mg | SUBCUTANEOUS | 9 refills | Status: DC

## 2024-03-05 ENCOUNTER — Other Ambulatory Visit: Payer: Self-pay | Admitting: *Deleted

## 2024-03-05 MED ORDER — FASENRA PEN 30 MG/ML ~~LOC~~ SOAJ: 30.0000 mg | SUBCUTANEOUS | 9 refills | Status: DC

## 2024-03-06 ENCOUNTER — Other Ambulatory Visit (HOSPITAL_COMMUNITY): Payer: Self-pay

## 2024-03-06 MED ORDER — FASENRA PEN 30 MG/ML ~~LOC~~ SOAJ: 30.0000 mg | SUBCUTANEOUS | 6 refills | Status: AC

## 2024-03-06 NOTE — Addendum Note (Signed)
 Addended by: OTHA MADELIN HERO on: 03/06/2024 08:34 AM   Modules accepted: Orders
